# Patient Record
Sex: Female | Born: 1970 | Hispanic: Yes | Smoking: Former smoker
Health system: Southern US, Community
[De-identification: ages and names within clinical notes are randomized; demographics above are authoritative.]

## PROBLEM LIST (undated history)

## (undated) DIAGNOSIS — E559 Vitamin D deficiency, unspecified: Secondary | ICD-10-CM

## (undated) DIAGNOSIS — F329 Major depressive disorder, single episode, unspecified: Principal | ICD-10-CM

## (undated) DIAGNOSIS — F419 Anxiety disorder, unspecified: Principal | ICD-10-CM

## (undated) HISTORY — DX: Major depressive disorder, single episode, unspecified: F32.9

## (undated) HISTORY — PX: APPENDECTOMY: SHX54

## (undated) HISTORY — DX: Vitamin D deficiency, unspecified: E55.9

## (undated) HISTORY — PX: CHOLECYSTECTOMY: SHX55

## (undated) HISTORY — DX: Anxiety disorder, unspecified: F41.9

---

## 2007-07-21 LAB — CONVERTED CEMR LAB: Pap Smear: NORMAL

## 2009-01-16 ENCOUNTER — Emergency Department (HOSPITAL_BASED_OUTPATIENT_CLINIC_OR_DEPARTMENT_OTHER): Admission: EM | Admit: 2009-01-16 | Discharge: 2009-01-16 | Payer: Self-pay | Admitting: Emergency Medicine

## 2009-10-23 ENCOUNTER — Ambulatory Visit: Payer: Self-pay | Admitting: Internal Medicine

## 2009-10-23 DIAGNOSIS — F32A Depression, unspecified: Secondary | ICD-10-CM

## 2009-10-23 DIAGNOSIS — F419 Anxiety disorder, unspecified: Secondary | ICD-10-CM

## 2009-10-23 DIAGNOSIS — R5381 Other malaise: Secondary | ICD-10-CM | POA: Insufficient documentation

## 2009-10-23 DIAGNOSIS — S6980XA Other specified injuries of unspecified wrist, hand and finger(s), initial encounter: Secondary | ICD-10-CM

## 2009-10-23 DIAGNOSIS — R5383 Other fatigue: Secondary | ICD-10-CM

## 2009-10-23 DIAGNOSIS — F329 Major depressive disorder, single episode, unspecified: Secondary | ICD-10-CM | POA: Insufficient documentation

## 2009-10-23 DIAGNOSIS — S6990XA Unspecified injury of unspecified wrist, hand and finger(s), initial encounter: Secondary | ICD-10-CM | POA: Insufficient documentation

## 2009-10-23 HISTORY — DX: Depression, unspecified: F32.A

## 2009-10-23 HISTORY — DX: Anxiety disorder, unspecified: F41.9

## 2009-10-29 LAB — CONVERTED CEMR LAB
Basophils Absolute: 0 10*3/uL (ref 0.0–0.1)
Basophils Relative: 0 % (ref 0.0–3.0)
CO2: 28 meq/L (ref 19–32)
Calcium: 9.3 mg/dL (ref 8.4–10.5)
Chloride: 106 meq/L (ref 96–112)
Eosinophils Absolute: 0.4 10*3/uL (ref 0.0–0.7)
Glucose, Bld: 81 mg/dL (ref 70–99)
Hemoglobin: 12 g/dL (ref 12.0–15.0)
Lymphocytes Relative: 29.1 % (ref 12.0–46.0)
Lymphs Abs: 2.7 10*3/uL (ref 0.7–4.0)
MCHC: 32.5 g/dL (ref 30.0–36.0)
MCV: 89.5 fL (ref 78.0–100.0)
Monocytes Absolute: 0.4 10*3/uL (ref 0.1–1.0)
Neutro Abs: 5.9 10*3/uL (ref 1.4–7.7)
RBC: 4.11 M/uL (ref 3.87–5.11)
RDW: 13.1 % (ref 11.5–14.6)
Sodium: 139 meq/L (ref 135–145)

## 2009-11-05 ENCOUNTER — Encounter: Payer: Self-pay | Admitting: Internal Medicine

## 2010-07-19 ENCOUNTER — Encounter: Payer: Self-pay | Admitting: Internal Medicine

## 2010-07-25 ENCOUNTER — Ambulatory Visit: Payer: Self-pay | Admitting: Internal Medicine

## 2010-11-19 NOTE — Assessment & Plan Note (Signed)
Summary: mmr/cbs  Nurse Visit  CC: MMR vacc./kb   Immunizations Administered:  MMR Vaccine # 1:    Vaccine Type: MMR    Site: left deltoid    Mfr: Merck    Dose: 0.5 ml    Route: IM    Given by: Lucious Groves CMA    Exp. Date: 01/26/2012    Lot #: [id number]    VIS given: 12/31/06 version given July 25, 2010.  Orders Added: 1)  MMR Vaccine SQ [90707] 2)  Admin 1st Vaccine [90471]   Patient is aware that she needs 2 MMR vaccinations. Patient will make appt for next month to receive the second. Lucious Groves CMA  July 25, 2010 11:20 AM

## 2010-11-19 NOTE — Assessment & Plan Note (Signed)
Summary: new to be est.- jr   Vital Signs:  Patient profile:   40 year old female Height:      64 inches Weight:      189.6 pounds BMI:     32.66 Pulse rate:   72 / minute BP sitting:   120 / 82  Vitals Entered By: Shary Decamp (October 23, 2009 1:14 PM) CC: new pt to est - not fasting Is Patient Diabetic? No Comments Multiple concerns:  - check veins in legs  - fell down stairs 1 week ago -- pain @ tailbone  - injury to left index finger years ago - c/o of finger "not working properly"  - fatigue Shary Decamp  October 23, 2009 1:15 PM    History of Present Illness: new patient, multiple concerns: --check  "spyder veins" in legs,  --fell down stairs 1 week ago, pain @ tailbone no hematoma no radiation, pain  worse when stands up or seats down -- injury to left index finger few months ago, range of motion has not returned.  The third finger was repaired  at the ER, the index finger was not suture --fatigue x 2 years , symptom is nonspecific, just lack of energy, see review of systems   Preventive Screening-Counseling & Management  Alcohol-Tobacco     Alcohol drinks/day: 0     Smoking Status: never  Caffeine-Diet-Exercise     Caffeine use/day: 2     Does Patient Exercise: no  Past History:  Past Medical History: G2 P2 Gyn Dr Joellyn Haff  Past Surgical History: Caesarean section x 2 Appendectomy Cholecystectomy  Family History: M - living F - living CAD - no DM - GF HTN - no stroke - no colon Ca - no breast Ca - no  Social History: Married 2 daughters tobacco--no ETOH-- rarely  original from [place] Smoking Status:  never Caffeine use/day:  2 Does Patient Exercise:  no  Review of Systems General:  sedentary life style diet healthy on-off . CV:  Denies chest pain or discomfort, palpitations, and swelling of feet. Resp:  + snoring somnolent at times, no innapropiate falling sleep. GI:  Denies diarrhea, nausea, and vomiting. Psych:  Denies  anxiety; + depression x years, on-off, mild miss her family in [place], a lot of work raising her daughters  poor concentration, ?ADHD .  Physical Exam  General:  alert, well-developed, and overweight-appearing.   Neck:  no masses and no thyromegaly.   Lungs:  normal respiratory effort, no intercostal retractions, no accessory muscle use, and normal breath sounds.   Heart:  normal rate, regular rhythm, no murmur, and no gallop.   Msk:  not tender over the coccyx area Extremities:  no lower extremity edema. Left hand: Index with eschar over the DIP, unable to flex the finger hundred percent. Third finger with a suture  scar, range of motion is normal Skin:  few spider veins in the legs Psych:  Oriented X3, memory intact for recent and remote, and good eye contact.  both emotional during the interview, tearful   Impression & Recommendations:  Problem # 1:  FATIGUE (ICD-780.79) likely multifactorial: deconditioning, depression, obesity, ?OSA labs recommend routine exercises. Reassess in 3 months  Orders: Venipuncture (53664) TLB-BMP (Basic Metabolic Panel-BMET) (80048-METABOL) TLB-CBC Platelet - w/Differential (85025-CBCD) TLB-TSH (Thyroid Stimulating Hormone) (84443-TSH) TLB-B12 + Folate Pnl (40347_42595-G38/VFI)  Problem # 2:  INJURY, FINGER (ICD-959.5) suspect that the scar tissue in the finger is preventing her finger to fully flex. 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 The patient is also concerned about ADHD. Refer to a psychologist  Orders: Psychology Referral (Psychology)  Problem # 4:  other issues spider veins: Observation. tailbone pain: Observation  Problem # 5:  ? of ADHD (ICD-314.01) see #3  Patient Instructions: 1)  Please schedule a follow-up appointment in 3 months .    Preventive Care  Screening  Pap Smear:    Date:  07/21/2007    Results:  normal     Mammogram  Procedure date:  06/20/2008  Findings:      No specific mammographic evidence of malignancy.     Mammogram  Procedure date:  06/20/2008  Findings:      No specific mammographic evidence of malignancy.

## 2010-11-19 NOTE — Consult Note (Signed)
Summary: [facility]   Imported By: Lanelle Bal 11/16/2009 13:19:37  _____________________________________________________________________  External Attachment:    Type:   Image     Comment:   External Document

## 2010-12-20 ENCOUNTER — Encounter: Payer: Self-pay | Admitting: Internal Medicine

## 2010-12-20 ENCOUNTER — Ambulatory Visit (INDEPENDENT_AMBULATORY_CARE_PROVIDER_SITE_OTHER): Payer: 59 | Admitting: Internal Medicine

## 2010-12-20 DIAGNOSIS — H6120 Impacted cerumen, unspecified ear: Secondary | ICD-10-CM | POA: Insufficient documentation

## 2010-12-20 DIAGNOSIS — H9209 Otalgia, unspecified ear: Secondary | ICD-10-CM

## 2010-12-26 NOTE — Assessment & Plan Note (Signed)
Summary: ear pain, hears high pitch noise///sph   Vital Signs:  Patient profile:   40 year old female Weight:      183.4 pounds BMI:     31.59 Temp:     98.7 degrees F oral Pulse rate:   64 / minute Resp:     15 per minute BP sitting:   112 / 78  (left arm) Cuff size:   large  Vitals Entered By: Shonna Chock CMA (December 20, 2010 4:04 PM) CC: Right ear pain and high pitch sound x 10 days or longer , Ear pain   CC:  Right ear pain and high pitch sound x 10 days or longer  and Ear pain.  History of Present Illness:    Onset 10-14 days ago as acute pain in R ear; she now  reports sensation of fullness and tinnitus, but denies ear discharge, hearing loss, fever, sinus pain, nasal discharge, and jaw click.  The pain is described as intermittent & sharp.  The patient denies headache, toothache, dizziness, and vertigo. Rx: none  Current Medications (verified): 1)  Prilosec Otc 20 Mg Tbec (Omeprazole Magnesium) .Marland Kitchen.. 1 By Mouth Once Daily  Allergies (verified): 1)  ! Sulfa  Physical Exam  General:  well-nourished,in no acute distress; alert,appropriate and cooperative throughout examination Ears:  L  & R Cerumen impaction, Rinne normal (Ac> BC), and Weber abnormal, lateralizes to R.  Whisper heard @ 6 ft  Nose:  External nasal examination shows no deformity or inflammation. Nasal mucosa are pink and moist without lesions or exudates. Mouth:  Oral mucosa and oropharynx without lesions or exudates.  Teeth in good repair. R tonsil enlarged  with  tonsilar inclusion Cervical Nodes:  No lymphadenopathy noted Axillary Nodes:  No palpable lymphadenopathy   Impression & Recommendations:  Problem # 1:  EAR PAIN, RIGHT (ICD-388.70) Component of Eustachian tube dysfunction from enlarged R tonsil  probably due to inflmmation from inclusion  Problem # 2:  CERUMEN IMPACTION, BILATERAL (ICD-380.4)  Complete Medication List: 1)  Prilosec Otc 20 Mg Tbec (Omeprazole magnesium) .Marland Kitchen.. 1 by mouth once  daily  Patient Instructions: 1)  Mineral oil 3 drops in ear at bedtime ; cover with cotton ball. In am soak canal with Hydrogen Peroxide for 10-15 min then shower , using thin washrag.Use a Water Pic to wash out debris from tonsils .The debris is causing inflammation & swelling which will block the Eustachian. ENT referral if no better.   Orders Added: 1)  Est. Patient Level III [27035]

## 2011-09-01 ENCOUNTER — Encounter: Payer: Self-pay | Admitting: Internal Medicine

## 2011-09-01 ENCOUNTER — Ambulatory Visit (INDEPENDENT_AMBULATORY_CARE_PROVIDER_SITE_OTHER): Payer: 59 | Admitting: Internal Medicine

## 2011-09-01 VITALS — BP 138/84 | HR 80 | Temp 98.4°F | Resp 20 | Ht 64.0 in | Wt 172.1 lb

## 2011-09-01 DIAGNOSIS — E871 Hypo-osmolality and hyponatremia: Secondary | ICD-10-CM | POA: Insufficient documentation

## 2011-09-01 DIAGNOSIS — E611 Iron deficiency: Secondary | ICD-10-CM

## 2011-09-01 NOTE — Progress Notes (Signed)
  Subjective:    Patient ID: Cassidy Shepard, female    DOB: Mar 30, 1971, 40 y.o.   MRN: [id number]  HPI Had labs done 08/20/2011 at work Labs were done  after she drank Gatorade, had some abnormalities that likes to d/w me  Blood sugar 102, serum sodium 133 slightly low, iron 30 slightly low, all other chemistries, CBC, cholesterol and thyroid tests were normal.  Past Medical History  Diagnosis Date  . No active medical problems    Past Surgical History  Procedure Date  . Appendectomy   . Cholecystectomy   . Cesarean section     x 2  . Intrauterine device insertion 2006   History   Social History  . Marital Status: Married    Spouse Name: N/A    Number of Children: 2  . Years of Education: N/A   Occupational History  . Not on file.   Social History Main Topics  . Smoking status: Never Smoker   . Smokeless tobacco: Never Used  . Alcohol Use: Yes     socially   . Drug Use: Not on file  . Sexually Active: Not on file   Other Topics Concern  . Not on file   Social History Narrative   Married, 2 daughters----original from [place]     Review of Systems Periods are normal, monthly, last 2-1/2 days. Denies nausea, vomiting, diarrhea, blood in the stools. Occasional epigastric discomfort.    Objective:   Physical Exam  Constitutional: She appears well-nourished.  Cardiovascular:  No murmur heard. Abdominal: Soft. Bowel sounds are normal. She exhibits no distension. There is no tenderness. There is no rebound and no guarding.  Musculoskeletal: She exhibits no edema.  Psychiatric: She has a normal mood and affect. Her behavior is normal. Judgment and thought content normal.          Assessment & Plan:  Abnormal labs: Very mild abnormal findings. Sodium slightly low, iron slightly low but no anemia. Findings unlikely to be significant. Recommend to repeat abnormal labs in 6 months. Also she is due for a gynecological eval, recommend the patient to set that up. F2F >  15 min discussing results

## 2011-12-02 ENCOUNTER — Ambulatory Visit (INDEPENDENT_AMBULATORY_CARE_PROVIDER_SITE_OTHER): Payer: 59 | Admitting: Internal Medicine

## 2011-12-02 VITALS — BP 116/74 | HR 76 | Temp 98.6°F | Wt 173.0 lb

## 2011-12-02 DIAGNOSIS — J329 Chronic sinusitis, unspecified: Secondary | ICD-10-CM

## 2011-12-02 MED ORDER — PREDNISONE 10 MG PO TABS: ORAL_TABLET | ORAL | Status: DC

## 2011-12-02 MED ORDER — FLUTICASONE PROPIONATE 50 MCG/ACT NA SUSP: 2.0000 | Freq: Every day | NASAL | Status: DC

## 2011-12-02 MED ORDER — AMOXICILLIN 500 MG PO CAPS: 1000.0000 mg | ORAL_CAPSULE | Freq: Two times a day (BID) | ORAL | Status: AC

## 2011-12-02 NOTE — Patient Instructions (Signed)
take Mucinex twice a day x 1 week For congestion use flonase x 1 month Take the antibiotic as prescribed ----> Amoxicillin  prednisone x 5 days Call if no better in few days, call anytime if the symptoms are severe cerumenex to help with wax Next visit by April to re do labs

## 2011-12-02 NOTE — Progress Notes (Signed)
  Subjective:    Patient ID: Cassidy Shepard, female    DOB: May 02, 1971, 41 y.o.   MRN: [id number]  HPI Acute visit In December she was  in an airplane and when they were landing she develop severe behind the R eye pain that lasted a couple of minutes. Since then she has on and off facial pressure, nasal pressure and a "nasal voice". Sinusitis?.  Past Medical History  Diagnosis Date  . No active medical problems    Past Surgical History  Procedure Date  . Appendectomy   . Cholecystectomy   . Cesarean section     x 2  . Intrauterine device insertion 2006    Review of Systems No fever or chills Occasional frontal headache Occasionally her eyes feel irritated but no visual disturbances. Postnasal dripping on and off. Denies any sore throat, and GERD symptoms, cough or chest congestion.      Objective:   Physical Exam  Constitutional: She appears well-developed and well-nourished. No distress.  HENT:  Head: Normocephalic and atraumatic.       Face symmetric, no TTP, wax B, throat normal, gum palpation wnl, no evidence of abscess. Nose wnl, voice noted to be nasal  Cardiovascular: Normal rate, regular rhythm and normal heart sounds.   No murmur heard. Pulmonary/Chest: Effort normal and breath sounds normal. No respiratory distress. She has no wheezes. She has no rales.  Skin: She is not diaphoretic.      Assessment & Plan:  Sinusitis: Several weeks h/o facial nasal pressure, subacute sinusitis? See instructions cerumenex for wax

## 2011-12-03 ENCOUNTER — Encounter: Payer: Self-pay | Admitting: Internal Medicine

## 2012-01-02 ENCOUNTER — Telehealth: Payer: Self-pay | Admitting: Internal Medicine

## 2012-01-02 DIAGNOSIS — J329 Chronic sinusitis, unspecified: Secondary | ICD-10-CM

## 2012-01-02 NOTE — Telephone Encounter (Signed)
Patient came in & stated her symptoms from the 11/2011 visit are no better. She states was told to let us know if they did not get any & we would order a CT scan. Please call  Harpreet @ [phone]

## 2012-01-02 NOTE — Telephone Encounter (Signed)
Please advise 

## 2012-01-02 NOTE — Telephone Encounter (Signed)
Advise patient: i prefer her to see ENT, please arrange referral

## 2012-01-02 NOTE — Telephone Encounter (Signed)
Spoke with pt, set up referral.

## 2012-02-25 ENCOUNTER — Other Ambulatory Visit: Payer: Self-pay | Admitting: Otolaryngology

## 2012-02-25 DIAGNOSIS — J342 Deviated nasal septum: Secondary | ICD-10-CM

## 2012-02-25 DIAGNOSIS — R51 Headache: Secondary | ICD-10-CM

## 2012-02-26 ENCOUNTER — Ambulatory Visit
Admission: RE | Admit: 2012-02-26 | Discharge: 2012-02-26 | Disposition: A | Discharge status: Home / Self Care | Payer: 59 | Source: Ambulatory Visit | Attending: Otolaryngology | Admitting: Otolaryngology

## 2012-02-26 DIAGNOSIS — J342 Deviated nasal septum: Secondary | ICD-10-CM

## 2012-02-26 DIAGNOSIS — R51 Headache: Secondary | ICD-10-CM

## 2012-09-24 ENCOUNTER — Other Ambulatory Visit (INDEPENDENT_AMBULATORY_CARE_PROVIDER_SITE_OTHER): Payer: 59

## 2012-09-24 ENCOUNTER — Ambulatory Visit (INDEPENDENT_AMBULATORY_CARE_PROVIDER_SITE_OTHER): Payer: 59 | Admitting: Internal Medicine

## 2012-09-24 ENCOUNTER — Encounter: Payer: Self-pay | Admitting: Internal Medicine

## 2012-09-24 VITALS — BP 120/76 | HR 73 | Temp 98.1°F | Ht 63.0 in | Wt 174.0 lb

## 2012-09-24 DIAGNOSIS — F419 Anxiety disorder, unspecified: Secondary | ICD-10-CM

## 2012-09-24 DIAGNOSIS — R5381 Other malaise: Secondary | ICD-10-CM

## 2012-09-24 DIAGNOSIS — F32A Depression, unspecified: Secondary | ICD-10-CM

## 2012-09-24 DIAGNOSIS — F411 Generalized anxiety disorder: Secondary | ICD-10-CM

## 2012-09-24 DIAGNOSIS — R5383 Other fatigue: Secondary | ICD-10-CM

## 2012-09-24 DIAGNOSIS — W19XXXA Unspecified fall, initial encounter: Secondary | ICD-10-CM

## 2012-09-24 DIAGNOSIS — M791 Myalgia, unspecified site: Secondary | ICD-10-CM

## 2012-09-24 DIAGNOSIS — F329 Major depressive disorder, single episode, unspecified: Secondary | ICD-10-CM

## 2012-09-24 LAB — LIPID PANEL
Cholesterol: 163 mg/dL (ref 0–200)
LDL Cholesterol: 99 mg/dL (ref 0–99)
Triglycerides: 122 mg/dL (ref 0.0–149.0)
VLDL: 24.4 mg/dL (ref 0.0–40.0)

## 2012-09-24 LAB — BASIC METABOLIC PANEL
Chloride: 105 mEq/L (ref 96–112)
Creatinine, Ser: 0.6 mg/dL (ref 0.4–1.2)
Potassium: 4.1 mEq/L (ref 3.5–5.1)

## 2012-09-24 LAB — CBC
MCHC: 32.9 g/dL (ref 30.0–36.0)
RDW: 13.4 % (ref 11.5–14.6)

## 2012-09-24 LAB — VITAMIN B12: Vitamin B-12: 382 pg/mL (ref 211–911)

## 2012-09-24 LAB — HEPATIC FUNCTION PANEL
AST: 13 U/L (ref 0–37)
Albumin: 3.9 g/dL (ref 3.5–5.2)
Alkaline Phosphatase: 57 U/L (ref 39–117)
Bilirubin, Direct: 0 mg/dL (ref 0.0–0.3)

## 2012-09-24 MED ORDER — ALPRAZOLAM 0.5 MG PO TABS: 0.5000 mg | ORAL_TABLET | Freq: Every evening | ORAL | Status: DC | PRN

## 2012-09-24 MED ORDER — FLUOXETINE HCL 20 MG PO TABS: 20.0000 mg | ORAL_TABLET | Freq: Every day | ORAL | Status: DC

## 2012-09-24 MED ORDER — CYCLOBENZAPRINE HCL 10 MG PO TABS: 10.0000 mg | ORAL_TABLET | Freq: Three times a day (TID) | ORAL | Status: DC | PRN

## 2012-09-24 NOTE — Patient Instructions (Signed)
Depression, Adult Depression refers to feeling sad, low, down in the dumps, blue, gloomy, or empty. In general, there are two kinds of depression: 1. Depression that we all experience from time to time because of upsetting life experiences, including the loss of a job or the ending of a relationship (normal sadness or normal grief). This kind of depression is considered normal, is short lived, and resolves within a few days to 2 weeks. (Depression experienced after the loss of a loved one is called bereavement. Bereavement often lasts longer than 2 weeks but normally gets better with time.) 2. Clinical depression, which lasts longer than normal sadness or normal grief or interferes with your ability to function at home, at work, and in school. It also interferes with your personal relationships. It affects almost every aspect of your life. Clinical depression is an illness. Symptoms of depression also can be caused by conditions other than normal sadness and grief or clinical depression. Examples of these conditions are listed as follows:  Physical illness Some physical illnesses, including underactive thyroid gland (hypothyroidism), severe anemia, specific types of cancer, diabetes, uncontrolled seizures, heart and lung problems, strokes, and chronic pain are commonly associated with symptoms of depression.  Side effects of some prescription medicine In some people, certain types of prescription medicine can cause symptoms of depression.  Substance abuse Abuse of alcohol and illicit drugs can cause symptoms of depression. SYMPTOMS Symptoms of normal sadness and normal grief include the following:  Feeling sad or crying for short periods of time.  Not caring about anything (apathy).  Difficulty sleeping or sleeping too much.  No longer able to enjoy the things you used to enjoy.  Desire to be by oneself all the time (social isolation).  Lack of energy or motivation.  Difficulty  concentrating or remembering.  Change in appetite or weight.  Restlessness or agitation. Symptoms of clinical depression include the same symptoms of normal sadness or normal grief and also the following symptoms:  Feeling sad or crying all the time.  Feelings of guilt or worthlessness.  Feelings of hopelessness or helplessness.  Thoughts of suicide or the desire to harm yourself (suicidal ideation).  Loss of touch with reality (psychotic symptoms). Seeing or hearing things that are not real (hallucinations) or having false beliefs about your life or the people around you (delusions and paranoia). DIAGNOSIS  The diagnosis of clinical depression usually is based on the severity and duration of the symptoms. Your caregiver also will ask you questions about your medical history and substance use to find out if physical illness, use of prescription medicine, or substance abuse is causing your depression. Your caregiver also may order blood tests. TREATMENT  Typically, normal sadness and normal grief do not require treatment. However, sometimes antidepressant medicine is prescribed for bereavement to ease the depressive symptoms until they resolve. The treatment for clinical depression depends on the severity of your symptoms but typically includes antidepressant medicine, counseling with a mental health professional, or a combination of both. Your caregiver will help to determine what treatment is best for you. Depression caused by physical illness usually goes away with appropriate medical treatment of the illness. If prescription medicine is causing depression, talk with your caregiver about stopping the medicine, decreasing the dose, or substituting another medicine. Depression caused by abuse of alcohol or illicit drugs abuse goes away with abstinence from these substances. Some adults need professional help in order to stop drinking or using drugs. SEEK IMMEDIATE CARE IF:  You have   thoughts  about hurting yourself or others.  You lose touch with reality (have psychotic symptoms).  You are taking medicine for depression and have a serious side effect. FOR MORE INFORMATION National Alliance on Mental Illness: www.nami.org National Institute of Mental Health: www.nimh.nih.gov Document Released: 10/03/2000 Document Revised: 04/06/2012 Document Reviewed: 01/05/2012 ExitCare Patient Information 2013 [employer]. Anxiety and Panic Attacks Your caregiver has informed you that you are having an anxiety or panic attack. There may be many forms of this. Most of the time these attacks come suddenly and without warning. They come at any time of day, including periods of sleep, and at any time of life. They may be strong and unexplained. Although panic attacks are very scary, they are physically harmless. Sometimes the cause of your anxiety is not known. Anxiety is a protective mechanism of the body in its fight or flight mechanism. Most of these perceived danger situations are actually nonphysical situations (such as anxiety over losing a job). CAUSES  The causes of an anxiety or panic attack are many. Panic attacks may occur in otherwise healthy people given a certain set of circumstances. There may be a genetic cause for panic attacks. Some medications may also have anxiety as a side effect. SYMPTOMS  Some of the most common feelings are:  Intense terror.  Dizziness, feeling faint.  Hot and cold flashes.  Fear of going crazy.  Feelings that nothing is real.  Sweating.  Shaking.  Chest pain or a fast heartbeat (palpitations).  Smothering, choking sensations.  Feelings of impending doom and that death is near.  Tingling of extremities, this may be from over-breathing.  Altered reality (derealization).  Being detached from yourself (depersonalization). Several symptoms can be present to make up anxiety or panic attacks. DIAGNOSIS  The evaluation by your caregiver will  depend on the type of symptoms you are experiencing. The diagnosis of anxiety or panic attack is made when no physical illness can be determined to be a cause of the symptoms. TREATMENT  Treatment to prevent anxiety and panic attacks may include:  Avoidance of circumstances that cause anxiety.  Reassurance and relaxation.  Regular exercise.  Relaxation therapies, such as yoga.  Psychotherapy with a psychiatrist or therapist.  Avoidance of caffeine, alcohol and illegal drugs.  Prescribed medication. SEEK IMMEDIATE MEDICAL CARE IF:   You experience panic attack symptoms that are different than your usual symptoms.  You have any worsening or concerning symptoms. Document Released: 10/06/2005 Document Revised: 12/29/2011 Document Reviewed: 02/07/2010 ExitCare Patient Information 2013 [employer].  

## 2012-09-24 NOTE — Progress Notes (Signed)
Subjective:    Patient ID: Cassidy Shepard, female    DOB: 09-07-1971, 41 y.o.   MRN: [id number]  HPI  Pt presents to the clinic today with c/o muscle aches, fatigue, depression and anxiety that causes chest pain. She is having severe marital problems. She feels like crying all time. She has little interest in spending time with her two children. She feels withdrawn and constantly upset. This has been an ongoing problem but has gotten much worse in the last week. The chest pain is intermittent, only when she is nervous and goes away quickly. She does spend most of her time at home in the bed. She has missed some work because of these episodes.  Review of Systems      Past Medical History  Diagnosis Date  . No active medical problems     Current Outpatient Prescriptions  Medication Sig Dispense Refill  . cetirizine (ZYRTEC) 10 MG tablet Take 10 mg by mouth daily.      . fluticasone (FLONASE) 50 MCG/ACT nasal spray Place 2 sprays into the nose daily.  16 g  2  . predniSONE (DELTASONE) 10 MG tablet 2 tabs a day x 5 days  10 tablet  0    Allergies  Allergen Reactions  . Sulfonamide Derivatives     REACTION: rash    No family history on file.  History   Social History  . Marital Status: Married    Spouse Name: N/A    Number of Children: 2  . Years of Education: N/A   Occupational History  . Not on file.   Social History Main Topics  . Smoking status: Never Smoker   . Smokeless tobacco: Never Used  . Alcohol Use: Yes     Comment: socially   . Drug Use: Not on file  . Sexually Active: Not on file   Other Topics Concern  . Not on file   Social History Narrative   Married, 2 daughters----original from [place]      Constitutional: Pt reports fatigue. Denies malaise, fever, headache or abrupt weight changes.  HEENT: Denies eye pain, eye redness, ear pain, ringing in the ears, wax buildup, runny nose, nasal congestion, bloody nose, or sore throat. Respiratory: Denies  difficulty breathing, shortness of breath, cough or sputum production.   Cardiovascular: Pt reports intermittant chest pain due to anxiety. Denies  chest tightness, palpitations or swelling in the hands or feet.  Gastrointestinal: Denies abdominal pain, bloating, constipation, diarrhea or blood in the stool.  GU: Denies urgency, frequency, pain with urination, burning sensation, blood in urine, odor or discharge. Musculoskeletal: Pt reports generalized muscle aches. Denies decrease in range of motion, difficulty with gait, muscle pain or joint pain and swelling.  Skin: Denies redness, rashes, lesions or ulcercations.  Neurological: Denies dizziness, difficulty with memory, difficulty with speech or problems with balance and coordination.   No other specific complaints in a complete review of systems (except as listed in HPI above).  Objective:   Physical Exam  BP 120/76  Pulse 73  Temp 98.1 F (36.7 C) (Oral)  Ht 5\' 3"  (1.6 m)  Wt 174 lb (78.926 kg)  BMI 30.82 kg/m2  SpO2 96% Wt Readings from Last 3 Encounters:  09/24/12 174 lb (78.926 kg)  12/02/11 173 lb (78.472 kg)  09/01/11 172 lb 2 oz (78.075 kg)    General: Appears her stated age, well developed, well nourished in NAD. Cardiovascular: Normal rate and rhythm. S1,S2 noted.  No murmur, rubs or  gallops noted. No JVD or BLE edema. No carotid bruits noted. Pulmonary/Chest: Normal effort and positive vesicular breath sounds. No respiratory distress. No wheezes, rales or ronchi noted.  Musculoskeletal: Normal range of motion. No signs of joint swelling. No difficulty with gait.  Neurological: Alert and oriented. Cranial nerves II-XII intact. Coordination normal. +DTRs bilaterally. Psychiatric: Mood and affect flat. Patient is crying. Behavior is normal. Judgment and thought content normal.      Assessment & Plan:   Depression and Anxiety, new onset with additional workup required:  eRx given for Prozac 20 mg daily eRx givne for  xanax 0.5 mg tid prn eRx given for flexeril Reassurance given, encouraged counseling.  RTC in1 month to reassess

## 2012-09-27 ENCOUNTER — Other Ambulatory Visit: Payer: Self-pay | Admitting: Internal Medicine

## 2012-09-27 ENCOUNTER — Encounter: Payer: Self-pay | Admitting: Internal Medicine

## 2012-09-27 ENCOUNTER — Telehealth: Payer: Self-pay | Admitting: *Deleted

## 2012-09-27 MED ORDER — ERGOCALCIFEROL 1.25 MG (50000 UT) PO CAPS: 50000.0000 [IU] | ORAL_CAPSULE | ORAL | Status: DC

## 2012-09-27 NOTE — Progress Notes (Signed)
Vit d level 19, will order ergocalciferol x 12 weeks, then repeat level

## 2012-09-27 NOTE — Telephone Encounter (Signed)
Left message for pt to callback office.     Ash, Can you please call Ms. Krebbs and let her know that all her labs were normal. Thx!  Rene Kocher

## 2012-09-28 NOTE — Telephone Encounter (Signed)
Pt informed of lab results. 

## 2012-12-19 IMAGING — CT CT PARANASAL SINUSES LIMITED
1 of 2 series · 10 of 13 positions shown, 13 images · non-contrast
Comparison: None.

CLINICAL DATA: Congestion and drainage.  Deviated nasal septum.
Headache.  Right maxillary and eye pain.

CT LIMITED SINUSES WITHOUT CONTRAST
TECHNIQUE: Multidetector CT images of the paranasal sinuses were
obtained in a single plane without contrast.

[Series 3: cor soft · axial · 0.31mm/px · z∈[+8,+98]mm · 10 of 12 slices shown, 13 images]
[im 2/12  brain]
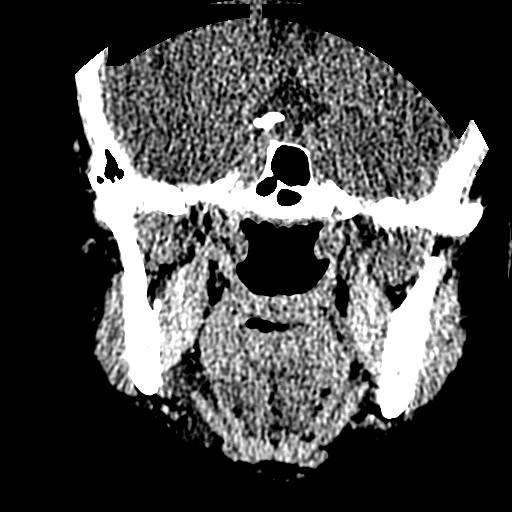
[im 2/12  bone]
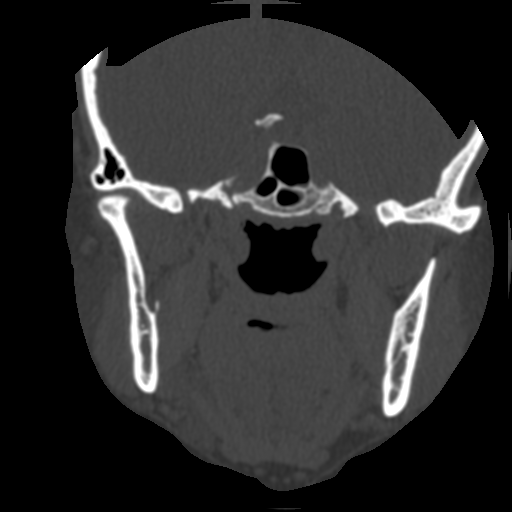
[im 3/12  bone]
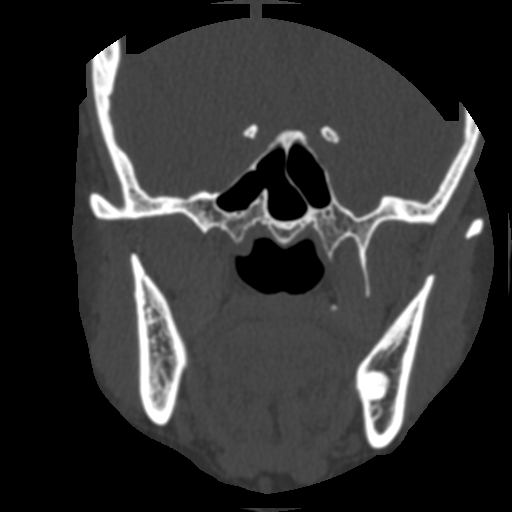
[im 4/12  bone]
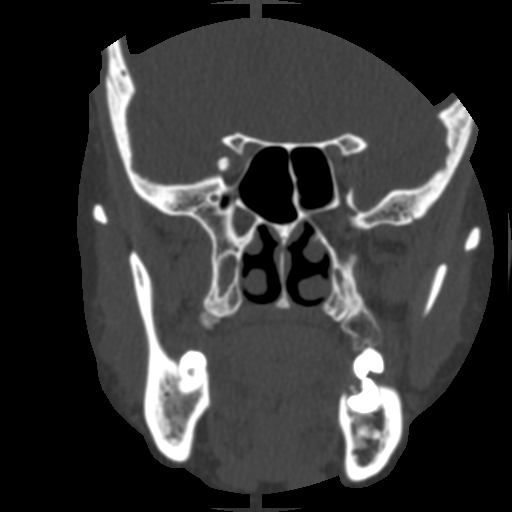
[im 5/12  bone]
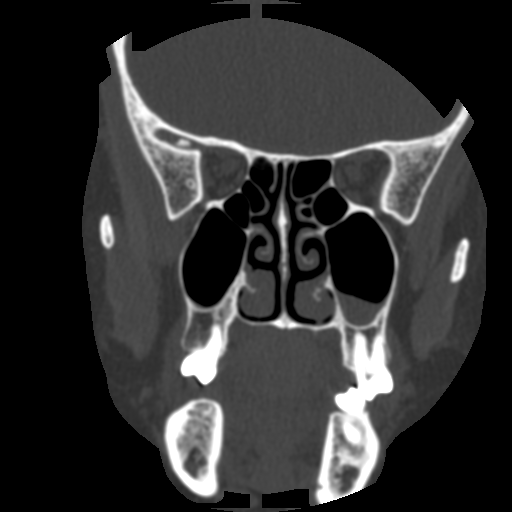
[im 6/12  brain]
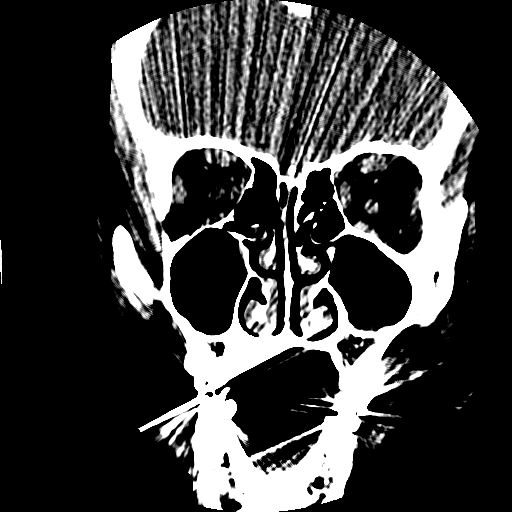
[im 6/12  bone]
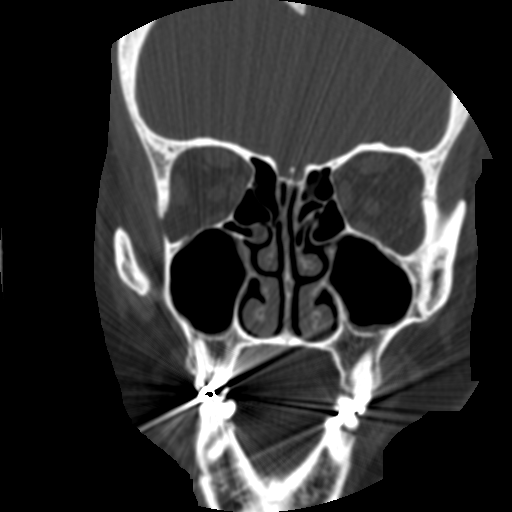
[im 7/12  bone]
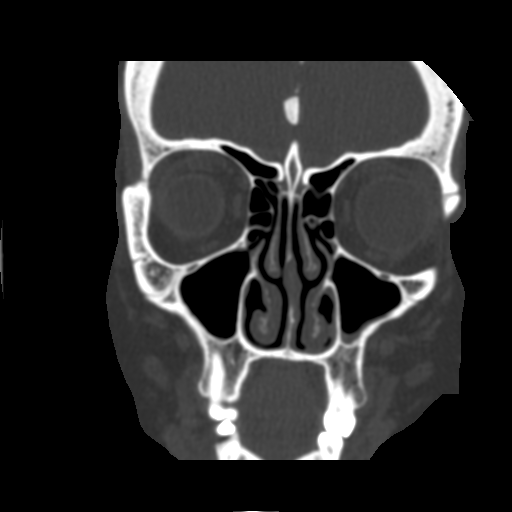
[im 8/12  bone]
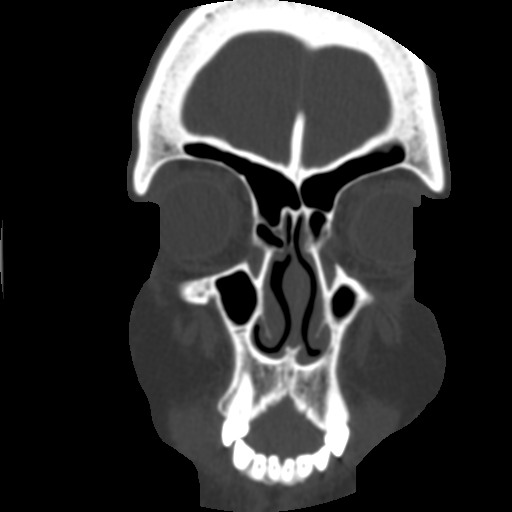
[im 9/12  bone]
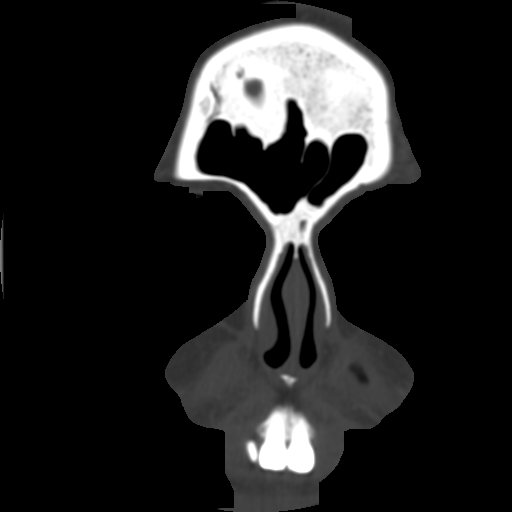
[im 10/12  brain]
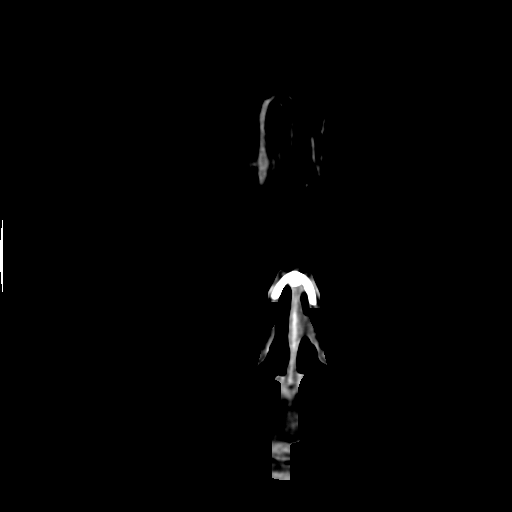
[im 10/12  bone]
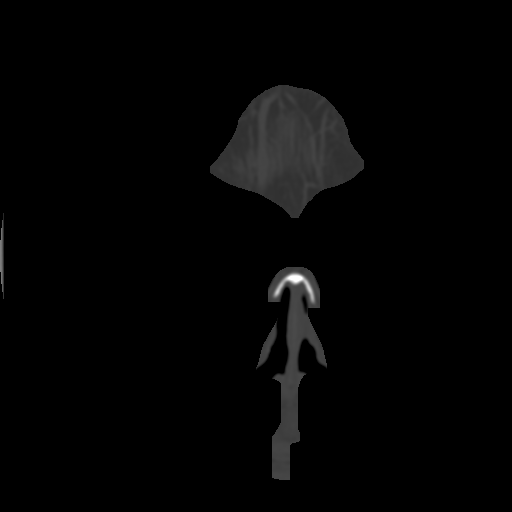
[im 11/12  bone]
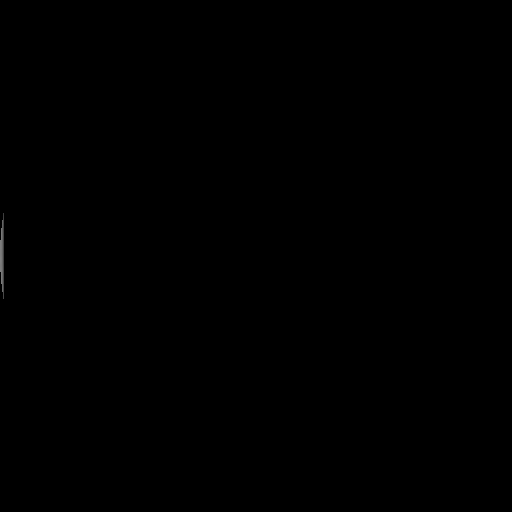

[10 of 13 positions shown; findings below may reference images not displayed]

FINDINGS: A small fluid level is present in the right maxillary
sinus.  Screening examination of the sinuses is otherwise negative.
No significant nasal septal deviation is evident.  Limited imaging
of the brain and orbits is unremarkable.
IMPRESSION: 1.  Mild mucosal thickening and fluid level in the right maxillary
sinus, compatible with acute sinusitis.
2.  No other significant sinus disease.

## 2013-11-02 ENCOUNTER — Other Ambulatory Visit: Payer: Self-pay | Admitting: Internal Medicine

## 2013-11-02 NOTE — Telephone Encounter (Signed)
Last filled 09/22/13 but pt has appt with Dr Larose Kells next week--please advise

## 2013-11-03 NOTE — Telephone Encounter (Signed)
Pt request status of alprazolam refill; pt advised as instructed; pt going out of town today and will cb upon return to schedule appt.

## 2013-11-09 ENCOUNTER — Telehealth: Payer: Self-pay

## 2013-11-09 NOTE — Telephone Encounter (Signed)
Left message for call back Non identifiable No pertinent data in chart

## 2013-11-11 ENCOUNTER — Encounter: Payer: Self-pay | Admitting: Internal Medicine

## 2013-11-11 ENCOUNTER — Ambulatory Visit (INDEPENDENT_AMBULATORY_CARE_PROVIDER_SITE_OTHER): Payer: 59 | Admitting: Internal Medicine

## 2013-11-11 VITALS — BP 142/89 | HR 70 | Temp 98.0°F | Wt 189.0 lb

## 2013-11-11 DIAGNOSIS — Z0000 Encounter for general adult medical examination without abnormal findings: Secondary | ICD-10-CM

## 2013-11-11 DIAGNOSIS — F329 Major depressive disorder, single episode, unspecified: Secondary | ICD-10-CM

## 2013-11-11 DIAGNOSIS — F419 Anxiety disorder, unspecified: Secondary | ICD-10-CM

## 2013-11-11 DIAGNOSIS — F32A Depression, unspecified: Secondary | ICD-10-CM

## 2013-11-11 LAB — TSH: TSH: 0.92 u[IU]/mL (ref 0.35–5.50)

## 2013-11-11 LAB — CBC WITH DIFFERENTIAL/PLATELET
BASOS ABS: 0 10*3/uL (ref 0.0–0.1)
Basophils Relative: 0.4 % (ref 0.0–3.0)
EOS PCT: 2.1 % (ref 0.0–5.0)
Eosinophils Absolute: 0.2 10*3/uL (ref 0.0–0.7)
HCT: 35.7 % — ABNORMAL LOW (ref 36.0–46.0)
Hemoglobin: 11.9 g/dL — ABNORMAL LOW (ref 12.0–15.0)
LYMPHS ABS: 2.5 10*3/uL (ref 0.7–4.0)
LYMPHS PCT: 23.2 % (ref 12.0–46.0)
MCHC: 33.4 g/dL (ref 30.0–36.0)
MCV: 85.7 fl (ref 78.0–100.0)
MONOS PCT: 7.1 % (ref 3.0–12.0)
Monocytes Absolute: 0.8 10*3/uL (ref 0.1–1.0)
NEUTROS ABS: 7.3 10*3/uL (ref 1.4–7.7)
Neutrophils Relative %: 67.2 % (ref 43.0–77.0)
Platelets: 342 10*3/uL (ref 150.0–400.0)
RBC: 4.16 Mil/uL (ref 3.87–5.11)
RDW: 14.3 % (ref 11.5–14.6)
WBC: 10.9 10*3/uL — ABNORMAL HIGH (ref 4.5–10.5)

## 2013-11-11 LAB — COMPREHENSIVE METABOLIC PANEL
ALT: 17 U/L (ref 0–35)
AST: 19 U/L (ref 0–37)
Albumin: 3.7 g/dL (ref 3.5–5.2)
Alkaline Phosphatase: 57 U/L (ref 39–117)
BUN: 6 mg/dL (ref 6–23)
CALCIUM: 8.8 mg/dL (ref 8.4–10.5)
CHLORIDE: 105 meq/L (ref 96–112)
CO2: 27 meq/L (ref 19–32)
Creatinine, Ser: 0.6 mg/dL (ref 0.4–1.2)
GFR: 118.78 mL/min (ref >60.00)
Glucose, Bld: 91 mg/dL (ref 70–99)
Potassium: 3.6 mEq/L (ref 3.5–5.1)
Sodium: 136 mEq/L (ref 135–145)
Total Bilirubin: 0.4 mg/dL (ref 0.3–1.2)
Total Protein: 7.7 g/dL (ref 6.0–8.3)

## 2013-11-11 LAB — CHOLESTEROL, TOTAL: CHOLESTEROL: 156 mg/dL (ref 0–200)

## 2013-11-11 MED ORDER — ALPRAZOLAM 0.5 MG PO TABS: 0.5000 mg | ORAL_TABLET | Freq: Every evening | ORAL | Status: DC | PRN

## 2013-11-11 NOTE — Progress Notes (Deleted)
Pre visit review using our clinic review tool, if applicable. No additional management support is needed unless otherwise documented below in the visit note. 

## 2013-11-11 NOTE — Progress Notes (Signed)
   Subjective:    Patient ID: Cassidy Shepard, female    DOB: 10-19-1971, 43 y.o.   MRN: [id number]  HPI  CPX We also discussed insomnia and depression, see assessment and plan  Past Medical History  Diagnosis Date  . No active medical problems    Past Surgical History  Procedure Laterality Date  . Appendectomy    . Cholecystectomy    . Cesarean section      x 2  . Intrauterine device insertion  2006   History   Social History  . Marital Status: Married    Spouse Name: N/A    Number of Children: 2  . Years of Education: N/A   Occupational History  . works for the city of [place]  . Smoking status: Current Some Day Smoker  . Smokeless tobacco: Never Used     Comment: uses the E-cigarrets   . Alcohol Use: Yes     Comment: socially   . Drug Use: No  . Sexual Activity: Not on file   Other Topics Concern  . Not on file   Social History Narrative   Married, 2 daughters; original from [place]       Family History  Problem Relation Age of Onset  . Colon cancer Neg Hx   . Breast cancer Neg Hx   . Diabetes Other     GF  . CAD Neg Hx      Review of Systems Diet-- regular  Exercise-- not consistent  No  CP, SOB, lower extremity edema Denies  nausea, vomiting diarrhea Denies  blood in the stools (-) cough, sputum production  Denies suicidal ideas        Objective:   Physical Exam BP 142/89  Pulse 70  Temp(Src) 98 F (36.7 C)  Wt 189 lb (85.73 kg)  SpO2 100%  General -- alert, well-developed, NAD.  Neck --no thyromegaly Lungs -- normal respiratory effort, no intercostal retractions, no accessory muscle use, and normal breath sounds.  Heart-- normal rate, regular rhythm, no murmur.  Abdomen-- Not distended, good bowel sounds,soft, non-tender. Extremities-- no pretibial edema bilaterally  Neurologic--  alert & oriented X3. Speech normal, gait normal, strength normal in all extremities.  Psych-- Cognition and judgment appear  intact. Cooperative with normal attention span and concentration. No anxious , slt depressed appearing.      Assessment & Plan:

## 2013-11-11 NOTE — Assessment & Plan Note (Addendum)
She reports that for the last year has been stressed out, she is in the process of separating from her husband. On 10/03/2012 she went elsewhere feeling very anxious, was prescribed Xanax and Prozac. He takes  Xanax rarely and took Prozac for one month only. At this point is both mildly anxious and depressed on and off, PHQ 9 is 14 which is moderate depression. For the past 2 weeks   had some difficulty sleeping, would like a refill of Xanax We took about treatment modalities and we agreed on the following: Xanax at bedtime to help her sleep See a counselor Increase exercise Reassess in 8-10 weeks. She's not ready for SSRIs at this point

## 2013-11-11 NOTE — Assessment & Plan Note (Signed)
Td 09-2013 per pt @ gyn office  Had flu shot Sees gyn for female care  Life style discussed Labs , see instructions

## 2013-11-11 NOTE — Patient Instructions (Signed)
Get your blood work before you leave and a UDS  Next visit is for routine check up regards anxiety  in 8-10 weeks  No need to come back fasting Please make an appointment

## 2013-11-12 LAB — VITAMIN D 25 HYDROXY (VIT D DEFICIENCY, FRACTURES): Vit D, 25-Hydroxy: 25 ng/mL — ABNORMAL LOW (ref 30–89)

## 2013-11-14 NOTE — Telephone Encounter (Signed)
Unable to reach prior to visit  

## 2013-11-15 ENCOUNTER — Encounter: Payer: Self-pay | Admitting: *Deleted

## 2013-11-15 MED ORDER — VITAMIN D (ERGOCALCIFEROL) 1.25 MG (50000 UNIT) PO CAPS: 50000.0000 [IU] | ORAL_CAPSULE | ORAL | Status: DC

## 2013-11-15 NOTE — Addendum Note (Signed)
Addended by: Peggyann Shoals on: 11/15/2013 01:51 PM   Modules accepted: Orders

## 2013-11-21 ENCOUNTER — Telehealth: Payer: Self-pay

## 2013-11-21 NOTE — Telephone Encounter (Signed)
UDS-11/14/2013 Negative--alprazolam--PRN Low risk per Dr Larose Kells

## 2013-11-22 ENCOUNTER — Telehealth: Payer: Self-pay | Admitting: Internal Medicine

## 2013-11-22 NOTE — Telephone Encounter (Signed)
Relevant patient education mailed to patient.  

## 2014-01-10 ENCOUNTER — Ambulatory Visit: Payer: 59 | Admitting: Internal Medicine

## 2014-01-10 DIAGNOSIS — Z0289 Encounter for other administrative examinations: Secondary | ICD-10-CM

## 2014-01-11 ENCOUNTER — Encounter: Payer: Self-pay | Admitting: Internal Medicine

## 2014-05-15 ENCOUNTER — Telehealth: Payer: Self-pay | Admitting: Internal Medicine

## 2014-05-15 NOTE — Telephone Encounter (Signed)
Pt states she was taking Xanax for sleep and it is not working.  She says she can fall asleep but cannot stay asleep. She would like a RX for sleep.  [place]

## 2014-05-15 NOTE — Telephone Encounter (Signed)
error 

## 2014-05-16 NOTE — Telephone Encounter (Signed)
Pt needs appt w/ Dr Larose Kells prior to getting additional prescriptions (has not been seen since Jan)

## 2014-05-18 NOTE — Telephone Encounter (Signed)
Pt is scheduled for 05/22/14

## 2014-05-22 ENCOUNTER — Ambulatory Visit (INDEPENDENT_AMBULATORY_CARE_PROVIDER_SITE_OTHER): Payer: 59 | Admitting: Internal Medicine

## 2014-05-22 ENCOUNTER — Telehealth: Payer: Self-pay | Admitting: Internal Medicine

## 2014-05-22 ENCOUNTER — Encounter: Payer: Self-pay | Admitting: Internal Medicine

## 2014-05-22 VITALS — BP 104/74 | HR 76 | Temp 98.3°F | Wt 174.0 lb

## 2014-05-22 DIAGNOSIS — F341 Dysthymic disorder: Secondary | ICD-10-CM

## 2014-05-22 DIAGNOSIS — E559 Vitamin D deficiency, unspecified: Secondary | ICD-10-CM

## 2014-05-22 DIAGNOSIS — F329 Major depressive disorder, single episode, unspecified: Secondary | ICD-10-CM

## 2014-05-22 DIAGNOSIS — F419 Anxiety disorder, unspecified: Secondary | ICD-10-CM

## 2014-05-22 DIAGNOSIS — F32A Depression, unspecified: Secondary | ICD-10-CM

## 2014-05-22 HISTORY — DX: Vitamin D deficiency, unspecified: E55.9

## 2014-05-22 MED ORDER — ALPRAZOLAM 0.5 MG PO TABS: 0.5000 mg | ORAL_TABLET | Freq: Every evening | ORAL | Status: DC | PRN

## 2014-05-22 NOTE — Assessment & Plan Note (Addendum)
Since the last time she was here, she took Xanax with good results, she is concerned about addiction to such medication, she is low risk. The patient is counseled today. Xanax is refilled. We talk about SSRIs such as Prozac, she is interested, side effects discussed, she will call when/if ready to take SSRIs. Recommend to see a counselor.

## 2014-05-22 NOTE — Assessment & Plan Note (Signed)
Status post ergocalciferol, recommend OTC vitamin D.

## 2014-05-22 NOTE — Progress Notes (Signed)
   Subjective:    Patient ID: Cassidy Shepard, female    DOB: 01-08-1971, 43 y.o.   MRN: [id number]  DOS:  05/22/2014 Type of visit - description: Followup from previous visit History: Since her last visit was here, she took Xanax, usually half or one tablet helps but she has not been taking it daily and is again having a difficult time staying asleep. Still having some anxiety and depression, denies any suicidal ideas, again symptoms related to being in the process of separation from her husband. Her 2 children are currently in [place]. History of vitamin D deficiency, status post ergocalciferol   ROS Is trying to be more active, has lost some weight.  Past Medical History  Diagnosis Date  . No active medical problems   . Anxiety and depression, insomnia 10/23/2009  . Unspecified vitamin D deficiency 05/22/2014    Past Surgical History  Procedure Laterality Date  . Appendectomy    . Cholecystectomy    . Cesarean section      x 2  . Intrauterine device insertion  2006    History   Social History  . Marital Status: Married    Spouse Name: N/A    Number of Children: 2  . Years of Education: N/A   Occupational History  . works for the city of [place]  . Smoking status: Current Some Day Smoker  . Smokeless tobacco: Never Used     Comment: uses the E-cigarrets   . Alcohol Use: Yes     Comment: socially   . Drug Use: No  . Sexual Activity: Not on file   Other Topics Concern  . Not on file   Social History Narrative   Married, 2 daughters; original from [place]            Medication List       This list is accurate as of: 05/22/14 10:28 PM.  Always use your most recent med list.               ALPRAZolam 0.5 MG tablet  Commonly known as:  XANAX  Take 1 tablet (0.5 mg total) by mouth at bedtime as needed for sleep.     cholecalciferol 1000 UNITS tablet  Commonly known as:  VITAMIN D  Take 1,000 Units by mouth daily.          Objective:   Physical Exam BP 104/74  Pulse 76  Temp(Src) 98.3 F (36.8 C)  Wt 174 lb (78.926 kg)  SpO2 100%  General -- alert, well-developed, NAD.  Psych-- Cognition and judgment appear intact. Cooperative with normal attention span and concentration.  Emotional during portions of the office visit     Assessment & Plan:    Today , I spent more than  15  min with the patient: >50% of the time counseling regards depression-anxiety- prozac s/e

## 2014-05-22 NOTE — Telephone Encounter (Signed)
Relevant patient education mailed to patient.  

## 2014-05-22 NOTE — Patient Instructions (Signed)
Next visit is for routine check up regards difficulty sleeping in 4 months

## 2014-05-22 NOTE — Progress Notes (Signed)
Pre visit review using our clinic review tool, if applicable. No additional management support is needed unless otherwise documented below in the visit note. 

## 2014-09-18 ENCOUNTER — Telehealth: Payer: Self-pay | Admitting: Internal Medicine

## 2014-09-18 MED ORDER — FLUOXETINE HCL 20 MG PO TABS: 20.0000 mg | ORAL_TABLET | Freq: Every day | ORAL | Status: DC

## 2014-09-18 NOTE — Telephone Encounter (Signed)
Caller name:  Brittaney Relation to pt: self Call back number: [phone] Pharmacy: [facility] on [address]  Reason for call:   Patient states that her and dr Larose Kells had talked about going on anti-depressant medication. Patient states that she would like to be prescribed something for depression.

## 2014-09-18 NOTE — Telephone Encounter (Signed)
prozac 20 mg #30  1 po qd ----  F/u 3-4 weeks with Dr Larose Kells

## 2014-09-18 NOTE — Telephone Encounter (Signed)
Spoke with Pt, informed her that Dr. Larose Kells is out of the country for the remainder of the week, however, Dr. Etter Sjogren has prescribed Prozac 20 mg 1 tablet daily. Informed Pt that medication has been sent to [facility] as requested. Informed her to make F/U appt in 3-4 weeks with Dr. Larose Kells. Pt verbalized understanding and stated she would call back if any side effects and when ready to schedule appt.

## 2014-09-18 NOTE — Telephone Encounter (Signed)
Pts last appt was 05/22/2014 regarding anxiety, depression, and insomnia. Pt currently takes Xanax 0.5 mg 1 tablet at bedtime PRN. Pt spoke with Dr. Larose Kells about possibly starting SSRI such as Prozac at this visit. Per OV notes Pt was to call if/when she was ready to begin taking SSRI. Please advise.

## 2015-02-23 ENCOUNTER — Encounter: Payer: Self-pay | Admitting: Internal Medicine

## 2015-02-23 ENCOUNTER — Ambulatory Visit (INDEPENDENT_AMBULATORY_CARE_PROVIDER_SITE_OTHER): Payer: 59 | Admitting: Internal Medicine

## 2015-02-23 ENCOUNTER — Other Ambulatory Visit: Payer: Self-pay

## 2015-02-23 VITALS — BP 118/68 | HR 69 | Temp 98.0°F | Ht 63.0 in | Wt 170.1 lb

## 2015-02-23 DIAGNOSIS — Z0000 Encounter for general adult medical examination without abnormal findings: Secondary | ICD-10-CM

## 2015-02-23 DIAGNOSIS — E611 Iron deficiency: Secondary | ICD-10-CM

## 2015-02-23 DIAGNOSIS — F419 Anxiety disorder, unspecified: Secondary | ICD-10-CM

## 2015-02-23 DIAGNOSIS — F32A Depression, unspecified: Secondary | ICD-10-CM

## 2015-02-23 DIAGNOSIS — F329 Major depressive disorder, single episode, unspecified: Secondary | ICD-10-CM

## 2015-02-23 LAB — BASIC METABOLIC PANEL
BUN: 9 mg/dL (ref 6–23)
CO2: 26 mEq/L (ref 19–32)
Calcium: 9 mg/dL (ref 8.4–10.5)
Chloride: 106 mEq/L (ref 96–112)
Creatinine, Ser: 0.68 mg/dL (ref 0.40–1.20)
GFR: 100.21 mL/min (ref >60.00)
GLUCOSE: 87 mg/dL (ref 70–99)
POTASSIUM: 4 meq/L (ref 3.5–5.1)
SODIUM: 136 meq/L (ref 135–145)

## 2015-02-23 LAB — LIPID PANEL
Cholesterol: 149 mg/dL (ref 0–200)
HDL: 41.1 mg/dL (ref >39.00)
LDL Cholesterol: 92 mg/dL (ref 0–99)
NonHDL: 107.9
TRIGLYCERIDES: 82 mg/dL (ref 0.0–149.0)
Total CHOL/HDL Ratio: 4
VLDL: 16.4 mg/dL (ref 0.0–40.0)

## 2015-02-23 LAB — CBC WITH DIFFERENTIAL/PLATELET
BASOS PCT: 0.5 % (ref 0.0–3.0)
Basophils Absolute: 0 10*3/uL (ref 0.0–0.1)
Eosinophils Absolute: 0.2 10*3/uL (ref 0.0–0.7)
Eosinophils Relative: 2.7 % (ref 0.0–5.0)
HCT: 36.9 % (ref 36.0–46.0)
HEMOGLOBIN: 12.4 g/dL (ref 12.0–15.0)
LYMPHS PCT: 23.5 % (ref 12.0–46.0)
Lymphs Abs: 2 10*3/uL (ref 0.7–4.0)
MCHC: 33.7 g/dL (ref 30.0–36.0)
MCV: 85.9 fl (ref 78.0–100.0)
MONOS PCT: 9.1 % (ref 3.0–12.0)
Monocytes Absolute: 0.8 10*3/uL (ref 0.1–1.0)
NEUTROS ABS: 5.6 10*3/uL (ref 1.4–7.7)
Neutrophils Relative %: 64.2 % (ref 43.0–77.0)
Platelets: 324 10*3/uL (ref 150.0–400.0)
RBC: 4.3 Mil/uL (ref 3.87–5.11)
RDW: 14.4 % (ref 11.5–15.5)
WBC: 8.7 10*3/uL (ref 4.0–10.5)

## 2015-02-23 LAB — FERRITIN: FERRITIN: 23.8 ng/mL (ref 10.0–291.0)

## 2015-02-23 LAB — VITAMIN D 25 HYDROXY (VIT D DEFICIENCY, FRACTURES): VITD: 10.39 ng/mL — AB (ref 30.00–100.00)

## 2015-02-23 LAB — TSH: TSH: 1.43 u[IU]/mL (ref 0.35–4.50)

## 2015-02-23 LAB — IRON: Iron: 79 ug/dL (ref 42–145)

## 2015-02-23 MED ORDER — ALPRAZOLAM 0.5 MG PO TABS: 0.5000 mg | ORAL_TABLET | Freq: Every evening | ORAL | Status: DC | PRN

## 2015-02-23 NOTE — Assessment & Plan Note (Addendum)
Since the last visit, she is doing better, separated from her husband, has her own place. Tried Prozac for 2 weeks and simply stopped, no side effects. Still needs xanax needed--->  refill Xanax, get a UDS, follow-up 6 months

## 2015-02-23 NOTE — Patient Instructions (Signed)
Get your blood work before you leave    Come back to the office in 6 months   for a routine check up

## 2015-02-23 NOTE — Assessment & Plan Note (Addendum)
Td 09-2013 per pt @ gyn office   Sees gyn for female care Dr Dellis Filbert; due to see her, she is overdue to change her IUD Life style discussed Labs  CBC, iron, ferritin, FLP, TSH , vitamin D (not taking vitamin D supplements)

## 2015-02-23 NOTE — Progress Notes (Signed)
Subjective:    Patient ID: Cassidy Shepard, female    DOB: 1971/07/16, 44 y.o.   MRN: [id number]  DOS:  02/23/2015 Type of visit - description : cpx Interval history: In general feeling well   Review of Systems Constitutional: No fever, chills. No unexplained wt changes. No unusual sweats. Mild cold intolerance HEENT: No dental problems, ear discharge, facial swelling, voice changes. No eye discharge, redness or intolerance to light Respiratory: No wheezing or difficulty breathing. No cough , mucus production Cardiovascular: No CP, leg swelling or palpitations GI: no nausea, vomiting, diarrhea or abdominal pain.  No blood in the stools. No dysphagia   Endocrine: No polyphagia, polyuria or polydipsia GU: No dysuria, gross hematuria, difficulty urinating. No urinary urgency or frequency. Musculoskeletal: No joint swellings or unusual aches or pains Skin: No change in the color of the skin, palor or rash Allergic, immunologic: No environmental allergies or food allergies Neurological: No dizziness or syncope. No headaches. No diplopia, slurred speech, motor deficits, facial numbness Hematological: No enlarged lymph nodes, easy bruising or bleeding Psychiatry: No suicidal ideas, hallucinations, behavior problems or confusion.  Since the last time I saw her, she separated from husband, feels better, has her own apartment, still needs Xanax as needed. Not taking Prozac.  Past Medical History  Diagnosis Date  . Anxiety and depression, insomnia 10/23/2009  . Unspecified vitamin D deficiency 05/22/2014    Past Surgical History  Procedure Laterality Date  . Appendectomy    . Cholecystectomy    . Cesarean section      x 2  . Intrauterine device insertion  2006    IUD    History   Social History  . Marital Status: Married    Spouse Name: N/A  . Number of Children: 2  . Years of Education: N/A   Occupational History  . works for the city of [place]  .  Smoking status: Former Research scientist (life sciences)  . Smokeless tobacco: Never Used  . Alcohol Use: 0.0 oz/week    0 Standard drinks or equivalent per week     Comment: socially   . Drug Use: No  . Sexual Activity: Not on file   Other Topics Concern  . Not on file   Social History Narrative   Married, separated , moved to her own place ~ 11-2014   2 daughters 26 and 44 y/o original from [place]            Medication List       This list is accurate as of: 02/23/15 11:59 PM.  Always use your most recent med list.               ALPRAZolam 0.5 MG tablet  Commonly known as:  XANAX  Take 1 tablet (0.5 mg total) by mouth at bedtime as needed for sleep.     cholecalciferol 1000 UNITS tablet  Commonly known as:  VITAMIN D  Take 1,000 Units by mouth daily.       Family History  Problem Relation Age of Onset  . Colon cancer Neg Hx   . Breast cancer Neg Hx   . Diabetes Other     GF  . CAD Neg Hx         Objective:   Physical Exam BP 118/68 mmHg  Pulse 69  Temp(Src) 98 F (36.7 C) (Oral)  Ht 5\' 3"  (1.6 m)  Wt 170 lb 2 oz (77.168 kg)  BMI 30.14 kg/m2  SpO2 97%  LMP 01/29/2015 (Exact Date)  General:   Well developed, well nourished . NAD.  Neck:  Full range of motion. Supple. No  Thyromegaly  HEENT:  Normocephalic . Face symmetric, atraumatic Lungs:  CTA B Normal respiratory effort, no intercostal retractions, no accessory muscle use. Heart: RRR,  no murmur.  No pretibial edema bilaterally  Abdomen:  Not distended, soft, non-tender. No rebound or rigidity. No mass,organomegaly Skin: Exposed areas without rash. Not pale. Not jaundice Neurologic:  alert & oriented X3.  Speech normal, gait appropriate for age and unassisted Strength symmetric and appropriate for age.  Psych: Cognition and judgment appear intact.  Cooperative with normal attention span and concentration.  Behavior appropriate. No anxious or depressed appearing.       Assessment & Plan:

## 2015-02-23 NOTE — Progress Notes (Signed)
Pre visit review using our clinic review tool, if applicable. No additional management support is needed unless otherwise documented below in the visit note. 

## 2015-02-23 NOTE — Assessment & Plan Note (Signed)
We are checking a hemoglobin, iron and ferritin.  her periods are more frequent than before, every 3 weeks, they  last 5 days.

## 2015-02-27 MED ORDER — VITAMIN D (ERGOCALCIFEROL) 1.25 MG (50000 UNIT) PO CAPS: 50000.0000 [IU] | ORAL_CAPSULE | ORAL | Status: DC

## 2015-02-27 NOTE — Addendum Note (Signed)
Addended by: Wilfrid Lund on: 02/27/2015 01:42 PM   Modules accepted: Orders

## 2015-03-09 ENCOUNTER — Telehealth: Payer: Self-pay

## 2015-03-09 NOTE — Telephone Encounter (Signed)
UDS: 02/23/2015  Negative for Alprazolam: PRN   Low risk per Dr. Larose Kells 03/09/2015

## 2015-04-20 HISTORY — PX: INTRAUTERINE DEVICE INSERTION: SHX323

## 2015-04-20 LAB — HM MAMMOGRAPHY: HM Mammogram: NORMAL (ref 0–4)

## 2015-04-20 LAB — HM PAP SMEAR: HM Pap smear: NORMAL

## 2015-05-16 ENCOUNTER — Telehealth: Payer: Self-pay | Admitting: Internal Medicine

## 2015-05-16 NOTE — Telephone Encounter (Signed)
Caller name:Maybelle Relationship to patient:self Can be reached: Pharmacy:  Reason for call:She wants a copy of her results from her cpe labs  From march  Please mail it to  her

## 2015-05-16 NOTE — Telephone Encounter (Signed)
Results from Pt's CPE in May 2016 printed and placed in mail as requested.

## 2015-09-12 ENCOUNTER — Other Ambulatory Visit: Payer: Self-pay | Admitting: Internal Medicine

## 2015-09-12 NOTE — Telephone Encounter (Signed)
Okay to refill #30 and 2 RFs

## 2015-09-12 NOTE — Telephone Encounter (Signed)
Rx faxed to [facility] pharmacy.  

## 2015-09-12 NOTE — Telephone Encounter (Signed)
Pt is requesting refill on Alprazolam.  Last OV: 02/23/2015, no future appt scheduled Last Fill: 02/23/2015 #30 and 4RF UDS: 02/23/2015 Low risk  Please advise.

## 2015-11-22 ENCOUNTER — Encounter: Payer: Self-pay | Admitting: Internal Medicine

## 2016-01-20 ENCOUNTER — Other Ambulatory Visit: Payer: Self-pay | Admitting: Internal Medicine

## 2016-01-21 NOTE — Telephone Encounter (Signed)
Okay #30 and one refill 

## 2016-01-21 NOTE — Telephone Encounter (Signed)
Rx printed, awaiting MD signature.  

## 2016-01-21 NOTE — Telephone Encounter (Signed)
Rx faxed to [facility] pharmacy.  

## 2016-01-21 NOTE — Telephone Encounter (Signed)
Pt is requesting refill on Alprazolam.  Last OV: 02/23/2015 Last Fill: 09/12/2015 #30 and 2RF UDS: 02/23/2015 Low risk  Please advise.

## 2016-01-31 ENCOUNTER — Other Ambulatory Visit: Payer: Self-pay

## 2016-02-20 HISTORY — PX: SKIN SURGERY: SHX2413

## 2016-02-25 ENCOUNTER — Encounter: Payer: Self-pay | Admitting: Behavioral Health

## 2016-02-25 ENCOUNTER — Telehealth: Payer: Self-pay | Admitting: Behavioral Health

## 2016-02-25 NOTE — Telephone Encounter (Signed)
Pre-Visit Call completed with patient and chart updated.   Pre-Visit Info documented in Specialty Comments under [employer].    

## 2016-02-26 ENCOUNTER — Encounter: Payer: Self-pay | Admitting: Internal Medicine

## 2016-02-26 ENCOUNTER — Ambulatory Visit (INDEPENDENT_AMBULATORY_CARE_PROVIDER_SITE_OTHER): Payer: BLUE CROSS/BLUE SHIELD | Admitting: Internal Medicine

## 2016-02-26 VITALS — BP 118/68 | HR 63 | Temp 98.1°F | Ht 63.0 in | Wt 171.2 lb

## 2016-02-26 DIAGNOSIS — E559 Vitamin D deficiency, unspecified: Secondary | ICD-10-CM

## 2016-02-26 DIAGNOSIS — Z0000 Encounter for general adult medical examination without abnormal findings: Secondary | ICD-10-CM

## 2016-02-26 DIAGNOSIS — E538 Deficiency of other specified B group vitamins: Secondary | ICD-10-CM

## 2016-02-26 DIAGNOSIS — Z09 Encounter for follow-up examination after completed treatment for conditions other than malignant neoplasm: Secondary | ICD-10-CM

## 2016-02-26 NOTE — Progress Notes (Signed)
Subjective:    Patient ID: Cassidy Shepard, female    DOB: [birth year], 45 y.o.   MRN: [id number]  DOS:  02/26/2016 Type of visit - description : CPX Interval history: In general feeling well   Review of Systems Constitutional: No fever. No chills. No unexplained wt changes. No unusual sweats  HEENT: No dental problems, no ear discharge, no facial swelling, no voice changes. No eye discharge, no eye  redness , no  intolerance to light   Respiratory: No wheezing , no  difficulty breathing. No cough , no mucus production  Cardiovascular: No CP, no leg swelling , no  Palpitations  GI: no nausea, no vomiting, no diarrhea , no  abdominal pain.  No blood in the stools. No dysphagia, no odynophagia    Endocrine: No polyphagia, no polyuria , no polydipsia. Some cold intolerance  GU: No dysuria, gross hematuria, difficulty urinating. No urinary urgency, no frequency.  Musculoskeletal: No joint swellings or unusual aches or pains  Skin: recently dx with an atypical mole, follow-up by dermatology    Allergic, immunologic: No environmental allergies , no  food allergies  Neurological: No dizziness no  syncope. No headaches. No diplopia, no slurred, no slurred speech, no motor deficits, no facial  Numbness  Hematological: No enlarged lymph nodes, no easy bruising , no unusual bleedings  Psychiatry: No suicidal ideas, no hallucinations, no beavior problems, no confusion.  Insomnia: On Xanax, not as effective as before. Still separated from her husband, some stress.   Past Medical History  Diagnosis Date  . Anxiety and depression, insomnia 10/23/2009  . Unspecified vitamin D deficiency 05/22/2014    Past Surgical History  Procedure Laterality Date  . Appendectomy    . Cholecystectomy    . Cesarean section      x 2  . Intrauterine device insertion  july 2016  . Skin surgery  02/20/16    Social History   Social History  . Marital Status: Married    Spouse Name: N/A  . Number of  Children: 2  . Years of Education: N/A   Occupational History  . works for the city of [place]  . Smoking status: Former Research scientist (life sciences)  . Smokeless tobacco: Never Used  . Alcohol Use: 0.0 oz/week    0 Standard drinks or equivalent per week     Comment: socially   . Drug Use: No  . Sexual Activity: Not on file   Other Topics Concern  . Not on file   Social History Narrative   Married, separated , moved to her own place ~ 11-2014   2 daughters 2001, 2005    original from [place]        / Family History  Problem Relation Age of Onset  . Colon cancer Neg Hx   . Breast cancer Neg Hx   . Diabetes Other     GF  . CAD Neg Hx   . Skin cancer Neg Hx        Medication List       This list is accurate as of: 02/26/16  7:14 PM.  Always use your most recent med list.               ALPRAZolam 0.5 MG tablet  Commonly known as:  XANAX  Take 1 tablet (0.5 mg total) by mouth at bedtime as needed for sleep.           Objective:   Physical  Exam BP 118/68 mmHg  Pulse 63  Temp(Src) 98.1 F (36.7 C) (Oral)  Ht 5\' 3"  (1.6 m)  Wt 171 lb 4 oz (77.678 kg)  BMI 30.34 kg/m2  SpO2 99%  LMP 02/17/2016 (Exact Date)  General:   Well developed, well nourished . NAD.  Neck: No  Thyromegaly  HEENT:  Normocephalic . Face symmetric, atraumatic. Lungs:  CTA B Normal respiratory effort, no intercostal retractions, no accessory muscle use. Heart: RRR,  no murmur.  No pretibial edema bilaterally  Abdomen:  Not distended, soft, non-tender. No rebound or rigidity.   Skin: Exposed areas without rash. Not pale. Not jaundice Neurologic:  alert & oriented X3.  Speech normal, gait appropriate for age and unassisted Strength symmetric and appropriate for age.  Psych: Cognition and judgment appear intact.  Cooperative with normal attention span and concentration.  Behavior appropriate. No anxious or depressed appearing.    Assessment & Plan:    Assessment Anxiety, depression, insomnia Vitamin D deficiency Atypical mole, chest 02-2016 Birth control -- IUD  Plan: Anxiety depression insomnia: Continue with Xanax, is not as effective as before,  tips for healthy sleep provided, okay to use also melatonin. Still has some depression, separated from husband, encouraged to see a counselor. Vitamin D deficiency: Recommend OTC dailies, check labs. Cold intolerance: Checking a TSH RTC one year

## 2016-02-26 NOTE — Progress Notes (Signed)
Pre visit review using our clinic review tool, if applicable. No additional management support is needed unless otherwise documented below in the visit note. 

## 2016-02-26 NOTE — Assessment & Plan Note (Addendum)
Td 09-2013 per pt @ gyn office   Sees gyn for female care Dr Dellis Filbert  Life style discussed. Labs : AST, ALT, vitamin D, B12, CBC, TSH. Declined HIV: Done at gynecology per pt

## 2016-02-26 NOTE — Assessment & Plan Note (Signed)
Anxiety depression insomnia: Continue with Xanax, is not as effective as before,  tips for healthy sleep provided, okay to use also melatonin. Still has some depression, separated from husband, encouraged to see a counselor. Vitamin D deficiency: Recommend OTC dailies, check labs. Cold intolerance: Checking a TSH RTC one year

## 2016-02-26 NOTE — Patient Instructions (Signed)
GO TO THE LAB : Get the blood work  , we also need a urine sample   GO TO THE FRONT DESK Schedule your next appointment for a  yearly checkup in one year, fasting  Continue with  Xanax as needed  Also consider take melatonin OTC 1 tablet at night as needed   Take over-the-counter vitamin D between 600 units an 1000 units every day

## 2016-02-27 ENCOUNTER — Other Ambulatory Visit: Payer: BLUE CROSS/BLUE SHIELD

## 2016-02-27 LAB — CBC WITH DIFFERENTIAL/PLATELET
BASOS ABS: 0.1 10*3/uL (ref 0.0–0.1)
Basophils Relative: 0.6 % (ref 0.0–3.0)
EOS ABS: 0.4 10*3/uL (ref 0.0–0.7)
Eosinophils Relative: 4 % (ref 0.0–5.0)
HEMATOCRIT: 35.8 % — AB (ref 36.0–46.0)
HEMOGLOBIN: 12.1 g/dL (ref 12.0–15.0)
LYMPHS PCT: 25.8 % (ref 12.0–46.0)
Lymphs Abs: 2.6 10*3/uL (ref 0.7–4.0)
MCHC: 33.9 g/dL (ref 30.0–36.0)
MCV: 85.4 fl (ref 78.0–100.0)
Monocytes Absolute: 0.7 10*3/uL (ref 0.1–1.0)
Monocytes Relative: 7.5 % (ref 3.0–12.0)
Neutro Abs: 6.2 10*3/uL (ref 1.4–7.7)
Neutrophils Relative %: 62.1 % (ref 43.0–77.0)
Platelets: 362 10*3/uL (ref 150.0–400.0)
RBC: 4.19 Mil/uL (ref 3.87–5.11)
RDW: 13.7 % (ref 11.5–15.5)
WBC: 10 10*3/uL (ref 4.0–10.5)

## 2016-02-27 LAB — AST: AST: 10 U/L (ref 0–37)

## 2016-02-27 LAB — TSH: TSH: 2.09 u[IU]/mL (ref 0.35–4.50)

## 2016-02-27 LAB — ALT: ALT: 9 U/L (ref 0–35)

## 2016-02-27 LAB — VITAMIN B12: Vitamin B-12: 203 pg/mL — ABNORMAL LOW (ref 211–911)

## 2016-03-01 LAB — VITAMIN D 1,25 DIHYDROXY
VITAMIN D2 1, 25 (OH): 53 pg/mL
VITAMIN D3 1, 25 (OH): 28 pg/mL
Vitamin D 1, 25 (OH)2 Total: 81 pg/mL — ABNORMAL HIGH (ref 18–72)

## 2016-03-06 NOTE — Addendum Note (Signed)
Addended byDamita Dunnings D on: 03/06/2016 10:20 AM   Modules accepted: Orders

## 2016-03-11 ENCOUNTER — Telehealth: Payer: Self-pay | Admitting: Internal Medicine

## 2016-03-11 NOTE — Telephone Encounter (Signed)
Relation to [id number] Call back [place]:  Reason for call:  atient inquiring about lab results and would like to speak with nurse directly.

## 2016-03-11 NOTE — Telephone Encounter (Signed)
Informed patient of message below, patient would like to speak with nurse directly.

## 2016-03-11 NOTE — Telephone Encounter (Signed)
Notes Recorded by Damita Dunnings, CMA on 03/06/2016 at 10:19 AM Letter printed and mailed to Pt, instructed her to call and schedule lab appt to be completed in 3 months to recheck her B12 levels. Notes Recorded by Damita Dunnings, CMA on 03/05/2016 at 10:28 AM [facility] informing Pt to return call regarding lab results. Notes Recorded by Colon Branch, MD on 03/04/2016 at 6:04 PM Call the patient: B12 is low, recommend a OTC B12 supplements every day. Vitamin D a little high, decrease vitamin D supplements to half of what she is taking. RTC for labs only 3 months to recheck a B12, please arrange. If not better consider shots.

## 2016-03-12 NOTE — Telephone Encounter (Signed)
Spoke w/ Pt, informed her of lab results, instructed her to start B12 OTC, she states she already takes 923mcg in her multivitamin daily, instructed to continue multivitamin and try picking up B12 around 500 mcg and we would recheck in 3 months. Pt has scheduled lab appt for 06/17/2016 at 0800.

## 2016-03-25 ENCOUNTER — Emergency Department (HOSPITAL_COMMUNITY)
Admission: EM | Admit: 2016-03-25 | Discharge: 2016-03-26 | Disposition: A | Discharge status: Home / Self Care | Payer: BLUE CROSS/BLUE SHIELD | Attending: Emergency Medicine | Admitting: Emergency Medicine

## 2016-03-25 ENCOUNTER — Encounter: Payer: Self-pay | Admitting: Internal Medicine

## 2016-03-25 ENCOUNTER — Encounter (HOSPITAL_COMMUNITY): Payer: Self-pay

## 2016-03-25 DIAGNOSIS — N390 Urinary tract infection, site not specified: Secondary | ICD-10-CM | POA: Diagnosis not present

## 2016-03-25 DIAGNOSIS — F329 Major depressive disorder, single episode, unspecified: Secondary | ICD-10-CM | POA: Insufficient documentation

## 2016-03-25 DIAGNOSIS — I10 Essential (primary) hypertension: Secondary | ICD-10-CM | POA: Diagnosis present

## 2016-03-25 DIAGNOSIS — R21 Rash and other nonspecific skin eruption: Secondary | ICD-10-CM | POA: Diagnosis not present

## 2016-03-25 DIAGNOSIS — H81399 Other peripheral vertigo, unspecified ear: Secondary | ICD-10-CM | POA: Diagnosis not present

## 2016-03-25 LAB — CBC WITH DIFFERENTIAL/PLATELET
BASOS ABS: 0 10*3/uL (ref 0.0–0.1)
BASOS PCT: 0 %
Eosinophils Absolute: 0.2 10*3/uL (ref 0.0–0.7)
Eosinophils Relative: 2 %
HEMATOCRIT: 37.2 % (ref 36.0–46.0)
HEMOGLOBIN: 12.2 g/dL (ref 12.0–15.0)
LYMPHS PCT: 22 %
Lymphs Abs: 2.2 10*3/uL (ref 0.7–4.0)
MCH: 28.5 pg (ref 26.0–34.0)
MCHC: 32.8 g/dL (ref 30.0–36.0)
MCV: 86.9 fL (ref 78.0–100.0)
MONO ABS: 0.7 10*3/uL (ref 0.1–1.0)
MONOS PCT: 7 %
NEUTROS ABS: 6.7 10*3/uL (ref 1.7–7.7)
NEUTROS PCT: 69 %
Platelets: 358 10*3/uL (ref 150–400)
RBC: 4.28 MIL/uL (ref 3.87–5.11)
RDW: 13.3 % (ref 11.5–15.5)
WBC: 9.9 10*3/uL (ref 4.0–10.5)

## 2016-03-25 LAB — I-STAT TROPONIN, ED: TROPONIN I, POC: 0 ng/mL (ref 0.00–0.08)

## 2016-03-25 LAB — PREGNANCY, URINE: Preg Test, Ur: NEGATIVE

## 2016-03-25 MED ORDER — MECLIZINE HCL 25 MG PO TABS
25.0000 mg | ORAL_TABLET | Freq: Once | ORAL | Status: AC
  Administered 2016-03-25: 25 mg via ORAL
  Filled 2016-03-25: qty 1

## 2016-03-25 NOTE — ED Provider Notes (Signed)
CSN: [id number]     Arrival date & time 03/25/16  2038 History  By signing my name below, I, Cassidy Shepard, attest that this documentation has been prepared under the direction and in the presence of Shanon Rosser, MD. Electronically Signed: Georgette Shepard, ED Scribe. 03/25/2016. 11:13 PM.   Chief Complaint  Patient presents with  . Hypertension   The history is provided by the patient. No language interpreter was used.    HPI Comments: Cassidy Shepard is a 45 y.o. female who presents to the Emergency Department complaining of intermittent headaches onset today. Patient also has associated "room-spinning" dizziness and lightheadedness which began around 6:30pm today while she was driving. Patient states that the dizziness is exacerbated with sudden movement. She also has associated intermittent chest pain onset around 8:30pm, she describes it as feeling like something was stuck in her throat. Patient stopped at a drug store, and found her blood pressure to be 147/94 around 7:30pm. She checked her blood pressure 20 minutes later and found out it was 156/99. Patient then went to another drugstore and checked it again, finding it to be 167/106 and decided to come here. Patient also reports she woke up with a pruritic rash to her forearms, neck and abdomen, for which she took Claritin and had relief of itching but not the rash. Patient denies nausea, shortness of breath and ringing of the ears.  Past Medical History  Diagnosis Date  . Anxiety and depression, insomnia 10/23/2009  . Unspecified vitamin D deficiency 05/22/2014   Past Surgical History  Procedure Laterality Date  . Appendectomy    . Cholecystectomy    . Cesarean section      x 2  . Intrauterine device insertion  july 2016  . Skin surgery  02/20/16   Family History  Problem Relation Age of Onset  . Colon cancer Neg Hx   . Breast cancer Neg Hx   . Diabetes Other     GF  . CAD Neg Hx   . Skin cancer Neg Hx    Social History  Substance Use Topics  .  Smoking status: Former Research scientist (life sciences)  . Smokeless tobacco: Never Used  . Alcohol Use: 0.0 oz/week    0 Standard drinks or equivalent per week     Comment: socially    OB History    No data available     Review of Systems  A complete 10 system review of systems was obtained and all systems are negative except as noted in the HPI and PMH.   Allergies  Sulfonamide derivatives  Home Medications   Prior to Admission medications   Medication Sig Start Date End Date Taking? Authorizing Provider  ALPRAZolam Duanne Moron) 0.5 MG tablet Take 1 tablet (0.5 mg total) by mouth at bedtime as needed for sleep. 01/21/16  Yes Colon Branch, MD  Cyanocobalamin (VITAMIN B 12 PO) Take 3,000 mg by mouth every morning.   Yes Historical Provider, MD   BP 122/90 mmHg  Pulse 65  Temp(Src) 98.5 F (36.9 C) (Oral)  Resp 16  SpO2 100%  LMP 03/15/2016   Physical Exam  General: Well-developed, well-nourished female in no acute distress; appearance consistent with age of record HENT: normocephalic; atraumatic Eyes: pupils equal, round and reactive to light; extraocular muscles intact; No nystagmus Neck: supple Heart: regular rate and rhythm Lungs: clear to auscultation bilaterally Abdomen: soft; nondistended; nontender; no masses or hepatosplenomegaly; bowel sounds present Extremities: No deformity; full range of motion; pulses normal Neurologic: Awake,  alert and oriented; motor function intact in all extremities and symmetric; no facial droop; normal coordination and speech Skin: Warm and dry; fine macular papular rash prominent on neck, abdomen and antecubital fossae  Psychiatric: Normal mood and affect   ED Course  Procedures (including critical care time)   MDM   Nursing notes and vitals signs, including pulse oximetry, reviewed.  Summary of this visit's results, reviewed by myself:   EKG Interpretation  Date/Time:  Tuesday March 25 2016 23:53:34 EDT Ventricular Rate:  69 PR Interval:  164 QRS  Duration: 103 QT Interval:  396 QTC Calculation: 424 R Axis:   83 Text Interpretation:  Sinus rhythm Normal ECG No previous ECGs available Confirmed by Mahathi Pokorney  MD, Jenny Reichmann ([id number]) on 03/26/2016 12:06:26 AM       Labs:  Results for orders placed or performed during the hospital encounter of 03/25/16 (from the past 24 hour(s))  CBC with Differential/Platelet     Status: None   Collection Time: 03/25/16 11:26 PM  Result Value Ref Range   WBC 9.9 4.0 - 10.5 K/uL   RBC 4.28 3.87 - 5.11 MIL/uL   Hemoglobin 12.2 12.0 - 15.0 g/dL   HCT 37.2 36.0 - 46.0 %   MCV 86.9 78.0 - 100.0 fL   MCH 28.5 26.0 - 34.0 pg   MCHC 32.8 30.0 - 36.0 g/dL   RDW 13.3 11.5 - 15.5 %   Platelets 358 150 - 400 K/uL   Neutrophils Relative % 69 %   Neutro Abs 6.7 1.7 - 7.7 K/uL   Lymphocytes Relative 22 %   Lymphs Abs 2.2 0.7 - 4.0 K/uL   Monocytes Relative 7 %   Monocytes Absolute 0.7 0.1 - 1.0 K/uL   Eosinophils Relative 2 %   Eosinophils Absolute 0.2 0.0 - 0.7 K/uL   Basophils Relative 0 %   Basophils Absolute 0.0 0.0 - 0.1 K/uL  Basic metabolic panel     Status: Abnormal   Collection Time: 03/25/16 11:26 PM  Result Value Ref Range   Sodium 139 135 - 145 mmol/L   Potassium 3.7 3.5 - 5.1 mmol/L   Chloride 105 101 - 111 mmol/L   CO2 26 22 - 32 mmol/L   Glucose, Bld 105 (H) 65 - 99 mg/dL   BUN 9 6 - 20 mg/dL   Creatinine, Ser 0.61 0.44 - 1.00 mg/dL   Calcium 9.2 8.9 - 10.3 mg/dL   GFR calc non Af Amer >60 >60 mL/min   GFR calc Af Amer >60 >60 mL/min   Anion gap 8 5 - 15  I-stat troponin, ED     Status: None   Collection Time: 03/25/16 11:33 PM  Result Value Ref Range   Troponin i, poc 0.00 0.00 - 0.08 ng/mL   Comment 3          Urinalysis, Routine w reflex microscopic (not at [facility])     Status: Abnormal   Collection Time: 03/25/16 11:38 PM  Result Value Ref Range   Color, Urine YELLOW YELLOW   APPearance CLOUDY (A) CLEAR   Specific Gravity, Urine 1.003 (L) 1.005 - 1.030   pH 6.5 5.0 - 8.0   Glucose,  UA NEGATIVE NEGATIVE mg/dL   Hgb urine dipstick SMALL (A) NEGATIVE   Bilirubin Urine NEGATIVE NEGATIVE   Ketones, ur NEGATIVE NEGATIVE mg/dL   Protein, ur NEGATIVE NEGATIVE mg/dL   Nitrite NEGATIVE NEGATIVE   Leukocytes, UA TRACE (A) NEGATIVE  Pregnancy, urine     Status: None  Collection Time: 03/25/16 11:38 PM  Result Value Ref Range   Preg Test, Ur NEGATIVE NEGATIVE  Urine microscopic-add on     Status: Abnormal   Collection Time: 03/25/16 11:38 PM  Result Value Ref Range   Squamous Epithelial / LPF 6-30 (A) NONE SEEN   WBC, UA 6-30 0 - 5 WBC/hpf   RBC / HPF 6-30 0 - 5 RBC/hpf   Bacteria, UA FEW (A) NONE SEEN   1:03 AM Vertigo improved after meclizine. Will treat for urinary tract infection. Patient was advised not to repeatedly take her blood pressure as this can actually cause her blood pressure to rise due to anxiety.  Final diagnoses:  Peripheral vertigo, unspecified laterality  UTI (lower urinary tract infection)  Rash   I personally performed the services described in this documentation, which was scribed in my presence. The recorded information has been reviewed and is accurate.   Shanon Rosser, MD 03/26/16 [phone]

## 2016-03-25 NOTE — ED Notes (Signed)
Pt states that she started to feel dizzy, lightheaded and having short of breath and stopped to take her blood pressure at the drug store, she took it three more times today and it was higher each time, she still has the headache ans states she now feels like she ate something hard and it's stuck in her throat

## 2016-03-25 NOTE — ED Notes (Signed)
Urine specimen collected by clean catch.

## 2016-03-26 LAB — BASIC METABOLIC PANEL
ANION GAP: 8 (ref 5–15)
BUN: 9 mg/dL (ref 6–20)
CHLORIDE: 105 mmol/L (ref 101–111)
CO2: 26 mmol/L (ref 22–32)
Calcium: 9.2 mg/dL (ref 8.9–10.3)
Creatinine, Ser: 0.61 mg/dL (ref 0.44–1.00)
GFR calc non Af Amer: >60 mL/min (ref >60)
GLUCOSE: 105 mg/dL — AB (ref 65–99)
Potassium: 3.7 mmol/L (ref 3.5–5.1)
Sodium: 139 mmol/L (ref 135–145)

## 2016-03-26 LAB — URINALYSIS, ROUTINE W REFLEX MICROSCOPIC
Bilirubin Urine: NEGATIVE
GLUCOSE, UA: NEGATIVE mg/dL
Ketones, ur: NEGATIVE mg/dL
Nitrite: NEGATIVE
PH: 6.5 (ref 5.0–8.0)
Protein, ur: NEGATIVE mg/dL
SPECIFIC GRAVITY, URINE: 1.003 — AB (ref 1.005–1.030)

## 2016-03-26 LAB — URINE MICROSCOPIC-ADD ON

## 2016-03-26 MED ORDER — MECLIZINE HCL 25 MG PO TABS: 25.0000 mg | ORAL_TABLET | Freq: Three times a day (TID) | ORAL | Status: DC | PRN

## 2016-03-26 MED ORDER — FOSFOMYCIN TROMETHAMINE 3 G PO PACK
3.0000 g | PACK | Freq: Once | ORAL | Status: AC
  Administered 2016-03-26: 3 g via ORAL
  Filled 2016-03-26: qty 3

## 2016-03-27 LAB — URINE CULTURE

## 2016-03-31 ENCOUNTER — Ambulatory Visit (INDEPENDENT_AMBULATORY_CARE_PROVIDER_SITE_OTHER): Payer: BLUE CROSS/BLUE SHIELD | Admitting: Internal Medicine

## 2016-03-31 ENCOUNTER — Encounter: Payer: Self-pay | Admitting: Internal Medicine

## 2016-03-31 VITALS — BP 122/72 | HR 68 | Temp 97.6°F | Ht 63.0 in | Wt 174.4 lb

## 2016-03-31 DIAGNOSIS — R030 Elevated blood-pressure reading, without diagnosis of hypertension: Secondary | ICD-10-CM | POA: Diagnosis not present

## 2016-03-31 DIAGNOSIS — R42 Dizziness and giddiness: Secondary | ICD-10-CM | POA: Diagnosis not present

## 2016-03-31 DIAGNOSIS — F329 Major depressive disorder, single episode, unspecified: Secondary | ICD-10-CM

## 2016-03-31 DIAGNOSIS — F32A Depression, unspecified: Secondary | ICD-10-CM

## 2016-03-31 DIAGNOSIS — F418 Other specified anxiety disorders: Secondary | ICD-10-CM

## 2016-03-31 DIAGNOSIS — IMO0001 Reserved for inherently not codable concepts without codable children: Secondary | ICD-10-CM

## 2016-03-31 DIAGNOSIS — F419 Anxiety disorder, unspecified: Secondary | ICD-10-CM

## 2016-03-31 MED ORDER — FLUOXETINE HCL 20 MG PO TABS: 20.0000 mg | ORAL_TABLET | Freq: Every day | ORAL | Status: DC

## 2016-03-31 NOTE — Progress Notes (Signed)
Subjective:    Patient ID: Cassidy Shepard, female    DOB: April 08, 1971, 45 y.o.   MRN: [id number]  DOS:  03/31/2016 Type of visit - description : ED f/u Interval history:  ER visit 03/25/2016, chart reviewed: was seen with a headache, dizziness described as the room spinning, acute onset Also had chest pain described as something stuck in her throat. BP was elevated prior to the arrival to the ER. Also had a rash  CBC, BMP within normal, UA c/w UTI , UCX- multiple bacteria EKG nsr --- Today, she confirmed description of her symptoms as reported on the ER note.  In addition, she reports that her job has been very stressful, she is having some financial issues as well. Since she left the hospital, she reports that continue having dizziness, described as lightheadedness, "heavy head", can't describe symptoms any better, they are on and off, no vertigo per se. Denies nausea, vomiting, diarrhea. No fever or chills. Sleeping okay as long as she takes Xanax No depression per se. She also has some headaches, on and off, at both sides of the head. Not the worst of her life. No diplopia,slurred speech or motor deficits  Also her BP has been checked at least twice since the ER visit, one time was normal, yesterday was 165/90 Also had a rash when she went to the ER: Resolved UA was consistent with infection, urine culture contaminated, denies any dysuria, gross hematuria difficulty urinating  BP Readings from Last 3 Encounters:  03/31/16 122/72  03/26/16 104/76  02/26/16 118/68      Review of Systems See above  Past Medical History  Diagnosis Date  . Anxiety and depression, insomnia 10/23/2009  . Unspecified vitamin D deficiency 05/22/2014    Past Surgical History  Procedure Laterality Date  . Appendectomy    . Cholecystectomy    . Cesarean section      x 2  . Intrauterine device insertion  july 2016  . Skin surgery  02/20/16    Social History   Social History  . Marital Status:  Married    Spouse Name: N/A  . Number of Children: 2  . Years of Education: N/A   Occupational History  . works for the city of [place]  . Smoking status: Former Research scientist (life sciences)  . Smokeless tobacco: Never Used  . Alcohol Use: 0.0 oz/week    0 Standard drinks or equivalent per week     Comment: socially   . Drug Use: No  . Sexual Activity: Not on file   Other Topics Concern  . Not on file   Social History Narrative   Married, separated , moved to her own place ~ 11-2014   2 daughters 2001, 2005    original from [place]            Medication List       This list is accurate as of: 03/31/16  5:13 PM.  Always use your most recent med list.               ALPRAZolam 0.5 MG tablet  Commonly known as:  XANAX  Take 1 tablet (0.5 mg total) by mouth at bedtime as needed for sleep.     FLUoxetine 20 MG tablet  Commonly known as:  PROZAC  Take 1 tablet (20 mg total) by mouth daily.     meclizine 25 MG tablet  Commonly known as:  ANTIVERT  Take 1 tablet (25  mg total) by mouth 3 (three) times daily as needed for dizziness.     VITAMIN B 12 PO  Take 3,000 mg by mouth every morning.           Objective:   Physical Exam BP 122/72 mmHg  Pulse 68  Temp(Src) 97.6 F (36.4 C) (Oral)  Ht 5\' 3"  (1.6 m)  Wt 174 lb 6 oz (79.096 kg)  BMI 30.90 kg/m2  SpO2 97%  LMP 03/15/2016 General:   Well developed, well nourished . NAD.  HEENT:  Normocephalic . Face symmetric, atraumatic Lungs:  CTA B Normal respiratory effort, no intercostal retractions, no accessory muscle use. Heart: RRR,  no murmur.  No pretibial edema bilaterally  Skin: Not pale. Not jaundice Neurologic:  alert & oriented X3.  Speech normal, gait appropriate for age and unassisted EOMI, pupils equal and reactive, DTRs symmetric Psych--  Cognition and judgment appear intact.  Cooperative with normal attention span and concentration.  Behavior appropriate. Slightly apprehensive  appearing, no depressed appearing.  Did get emotional at some point during the visit.      Assessment & Plan:   Assessment Anxiety, depression, insomnia Vitamin D deficiency Atypical mole, chest 02-2016 Birth control -- IUD  PLAN: Dizziness, headache: Since the ER visit continue with a ill-defined dizziness, no clear-cut vertigo. To be sure we'll get a CT of the head. Elevated BP: BP was elevated prior to the ER visit and one time since. BP today normal. Recommend to continue checking.  Anxiety depression and insomnia: More stress at work lately, some financial issues. I believe stress is playing a role her symptoms and elevated BP, she agrees with me. We again discussed the benefits of daily treatment with SSRIs and she   would like to try. Plan: Start fluoxetine, follow-up in one month. Also plans to see a counselor which I agree with. UTI?: No symptoms, urine culture negative RTC one month

## 2016-03-31 NOTE — Patient Instructions (Signed)
We are ordering a cat scan  Start fluoxetine 20 mg: 1/2 tablet a day x 10 days, then 1 tablet a day  Check the  blood pressure 2 or 3 times a week  Be sure your blood pressure is between 110/65 and  145/85.  if it is consistently higher or lower, let me know   GO TO THE FRONT DESK Schedule your next appointment for a  CHECK UP IN 1 MONTHS    If you have severe headache or dizzines let us know or go to the ER

## 2016-03-31 NOTE — Assessment & Plan Note (Signed)
Dizziness, headache: Since the ER visit continue with a ill-defined dizziness, no clear-cut vertigo. To be sure we'll get a CT of the head. Elevated BP: BP was elevated prior to the ER visit and one time since. BP today normal. Recommend to continue checking.  Anxiety depression and insomnia: More stress at work lately, some financial issues. I believe stress is playing a role her symptoms and elevated BP, she agrees with me. We again discussed the benefits of daily treatment with SSRIs and she   would like to try. Plan: Start fluoxetine, follow-up in one month. Also plans to see a counselor which I agree with. UTI?: No symptoms, urine culture negative RTC one month

## 2016-03-31 NOTE — Progress Notes (Signed)
Pre visit review using our clinic review tool, if applicable. No additional management support is needed unless otherwise documented below in the visit note. 

## 2016-04-03 ENCOUNTER — Ambulatory Visit (HOSPITAL_BASED_OUTPATIENT_CLINIC_OR_DEPARTMENT_OTHER): Payer: BLUE CROSS/BLUE SHIELD

## 2016-04-14 ENCOUNTER — Telehealth: Payer: Self-pay

## 2016-04-14 MED ORDER — ALPRAZOLAM 0.5 MG PO TABS: 0.5000 mg | ORAL_TABLET | Freq: Every evening | ORAL | Status: DC | PRN

## 2016-04-14 NOTE — Telephone Encounter (Signed)
Rx faxed to [facility] pharmacy.  

## 2016-04-14 NOTE — Telephone Encounter (Signed)
Pt is requesting refill on Alprazolam.  Last OV: 03/31/2016 Last Fill: 01/31/2016 #30 and 1RF UDS: 02/23/2015 Low risk  Please advise.

## 2016-04-14 NOTE — Telephone Encounter (Signed)
Okay #30 and one refill 

## 2016-04-14 NOTE — Telephone Encounter (Signed)
Rx printed, awaiting MD signature.  

## 2016-04-17 ENCOUNTER — Telehealth: Payer: Self-pay

## 2016-04-17 NOTE — Telephone Encounter (Signed)
UDS: 02/27/2016  Negative for Alprazolam: PRN (Benzodiazepine detected)   Low risk per Dr. Larose Kells 04/17/2016

## 2016-04-25 ENCOUNTER — Telehealth: Payer: Self-pay | Admitting: Internal Medicine

## 2016-04-25 NOTE — Telephone Encounter (Signed)
Pt has an appt scheduled with Dr. Larose Kells on 04/30/16 at 4 pm.

## 2016-04-25 NOTE — Telephone Encounter (Signed)
Patient Name: Cassidy Shepard  DOB: 06-07-1971    Initial Comment Caller states bp is up, 143/88, feels tired.   Nurse Assessment  Nurse: Mallie Mussel, RN, Alveta Heimlich Date/Time Eilene Ghazi Time): 04/25/2016 2:23:15 PM  Confirm and document reason for call. If symptomatic, describe symptoms. You must click the next button to save text entered. ---Caller states that she feels that her BP is up. This morning, it was 143/88. She has an appointment for Wednesday of next week and wants to know if there is any way of being seen sooner than that. Denies headache. She denies unilateral weakness and numbness. She states that her BP has been higher than this, last night it was 153/98.  Has the patient traveled out of the country within the last 30 days? ---No  Does the patient have any new or worsening symptoms? ---Yes  Will a triage be completed? ---Yes  Related visit to physician within the last 2 weeks? ---No  Does the PT have any chronic conditions? (i.e. diabetes, asthma, etc.) ---Yes  List chronic conditions. ---Depression/Anxiety  Is the patient pregnant or possibly pregnant? (Ask all females between the ages of 44-55) ---No  Is this a behavioral health or substance abuse call? ---No     Guidelines    Guideline Title Affirmed Question Affirmed Notes  High Blood Pressure [id number] Systolic BP >= [id number] OR Diastolic >= 90 AND [id number] not taking BP medications    Final Disposition User   See PCP within [facility], RN, Alveta Heimlich    Disagree/Comply: Leta Baptist

## 2016-04-29 ENCOUNTER — Encounter: Payer: Self-pay | Admitting: Internal Medicine

## 2016-04-30 ENCOUNTER — Encounter: Payer: Self-pay | Admitting: Internal Medicine

## 2016-04-30 ENCOUNTER — Ambulatory Visit (INDEPENDENT_AMBULATORY_CARE_PROVIDER_SITE_OTHER): Payer: BLUE CROSS/BLUE SHIELD | Admitting: Internal Medicine

## 2016-04-30 VITALS — BP 128/82 | HR 72 | Temp 98.4°F | Ht 63.0 in | Wt 167.0 lb

## 2016-04-30 DIAGNOSIS — F418 Other specified anxiety disorders: Secondary | ICD-10-CM | POA: Diagnosis not present

## 2016-04-30 DIAGNOSIS — F32A Depression, unspecified: Secondary | ICD-10-CM

## 2016-04-30 DIAGNOSIS — R030 Elevated blood-pressure reading, without diagnosis of hypertension: Secondary | ICD-10-CM | POA: Diagnosis not present

## 2016-04-30 DIAGNOSIS — F419 Anxiety disorder, unspecified: Principal | ICD-10-CM

## 2016-04-30 DIAGNOSIS — E538 Deficiency of other specified B group vitamins: Secondary | ICD-10-CM

## 2016-04-30 DIAGNOSIS — IMO0001 Reserved for inherently not codable concepts without codable children: Secondary | ICD-10-CM

## 2016-04-30 DIAGNOSIS — F329 Major depressive disorder, single episode, unspecified: Secondary | ICD-10-CM

## 2016-04-30 DIAGNOSIS — R42 Dizziness and giddiness: Secondary | ICD-10-CM | POA: Diagnosis not present

## 2016-04-30 MED ORDER — METOPROLOL TARTRATE 25 MG PO TABS: 25.0000 mg | ORAL_TABLET | Freq: Two times a day (BID) | ORAL | Status: DC

## 2016-04-30 MED ORDER — FLUOXETINE HCL 20 MG PO TABS: 30.0000 mg | ORAL_TABLET | Freq: Every day | ORAL | Status: DC

## 2016-04-30 NOTE — Progress Notes (Signed)
Subjective:    Patient ID: Cassidy Shepard, female    DOB: October 13, 1971, 45 y.o.   MRN: [id number]  DOS:  04/30/2016 Type of visit - description : rov Interval history: Anxiety: started Fluoxetine, no major difference. Headaches, did not pursue a head CT, symptoms are much improved. Chest pain, has occasional minor discomfort on and off Elevated BP: Ambulatory BPs range from 130 to 150. Diastolic BPs 80 to 0000000. States she can "feel" when the BP is high mostly with chest discomfort.    Review of Systems No depression or suicidal ideas. Sleep is okay with alprazolam  Past Medical History  Diagnosis Date  . Anxiety and depression, insomnia 10/23/2009  . Unspecified vitamin D deficiency 05/22/2014    Past Surgical History  Procedure Laterality Date  . Appendectomy    . Cholecystectomy    . Cesarean section      x 2  . Intrauterine device insertion  july 2016  . Skin surgery  02/20/16    Social History   Social History  . Marital Status: Married    Spouse Name: N/A  . Number of Children: 2  . Years of Education: N/A   Occupational History  . works for the city of [place]  . Smoking status: Former Research scientist (life sciences)  . Smokeless tobacco: Never Used  . Alcohol Use: 0.0 oz/week    0 Standard drinks or equivalent per week     Comment: socially   . Drug Use: No  . Sexual Activity: Not on file   Other Topics Concern  . Not on file   Social History Narrative   Married, separated , moved to her own place ~ 11-2014   2 daughters 2001, 2005    original from [place]            Medication List       This list is accurate as of: 04/30/16 11:59 PM.  Always use your most recent med list.               ALPRAZolam 0.5 MG tablet  Commonly known as:  XANAX  Take 1 tablet (0.5 mg total) by mouth at bedtime as needed for sleep.     FLUoxetine 20 MG tablet  Commonly known as:  PROZAC  Take 1.5 tablets (30 mg total) by mouth daily.     meclizine 25 MG  tablet  Commonly known as:  ANTIVERT  Take 1 tablet (25 mg total) by mouth 3 (three) times daily as needed for dizziness.     metoprolol tartrate 25 MG tablet  Commonly known as:  LOPRESSOR  Take 1 tablet (25 mg total) by mouth 2 (two) times daily.     VITAMIN B 12 PO  Take 3,000 mg by mouth every morning.           Objective:   Physical Exam BP 128/82 mmHg  Pulse 72  Temp(Src) 98.4 F (36.9 C) (Oral)  Ht 5\' 3"  (1.6 m)  Wt 167 lb (75.751 kg)  BMI 29.59 kg/m2  SpO2 99%  LMP 04/10/2016 (Exact Date) General:   Well developed, well nourished . NAD.  HEENT:  Normocephalic . Face symmetric, atraumatic Skin: Not pale. Not jaundice Neurologic:  alert & oriented X3.  Speech normal, gait appropriate for age and unassisted. Neck supple. Motor symmetric. Psych--  Cognition and judgment appear intact.  Cooperative with normal attention span and concentration.  Behavior appropriate. No anxious or depressed appearing.  Assessment & Plan:   Assessment Anxiety, depression, insomnia Vitamin D deficiency Atypical mole, chest 02-2016 Birth control -- IUD  PLAN: Dizziness, headache: Improved, did not pursue a CT head. Elevated BP: BP today is very good, at home it has been as high as 150/90. Start  metoprolol 25 mg twice a day, okay to decrease the dose to half if needed. See instructions Anxiety, insomnia: Continue with Xanax for insomnia, started fluoxetine 4 weeks ago, so far no improvement, increase fluoxetine to 30 mg and come back in 6 weeks, encourage counseling. B12 deficiency: On oral supplements, check labs. RTC 6 weeks.  Today, I spent more than   25 min with the patient: >50% of the time counseling regards  anxiety management, listening to her concerns

## 2016-04-30 NOTE — Patient Instructions (Signed)
GO TO THE LAB : Get the blood work     GO TO THE FRONT DESK Schedule your next appointment for a  Check up in 6 weeks    Start  metoprolol 25 mg 1 tab twice a day, if too strong, take only 1/2 tablet twice a day   Check the  blood pressure 2 or 3 times a week Be sure your blood pressure is between 110/65 and  145/85. If it is consistently higher or lower, let me know

## 2016-04-30 NOTE — Progress Notes (Signed)
Pre visit review using our clinic review tool, if applicable. No additional management support is needed unless otherwise documented below in the visit note. 

## 2016-05-01 LAB — VITAMIN B12: VITAMIN B 12: 430 pg/mL (ref 211–911)

## 2016-05-01 NOTE — Assessment & Plan Note (Signed)
Dizziness, headache: Improved, did not pursue a CT head. Elevated BP: BP today is very good, at home it has been as high as 150/90. Start  metoprolol 25 mg twice a day, okay to decrease the dose to half if needed. See instructions Anxiety, insomnia: Continue with Xanax for insomnia, started fluoxetine 4 weeks ago, so far no improvement, increase fluoxetine to 30 mg and come back in 6 weeks, encourage counseling. B12 deficiency: On oral supplements, check labs. RTC 6 weeks.

## 2016-06-11 ENCOUNTER — Ambulatory Visit (INDEPENDENT_AMBULATORY_CARE_PROVIDER_SITE_OTHER): Payer: BLUE CROSS/BLUE SHIELD | Admitting: Internal Medicine

## 2016-06-11 ENCOUNTER — Encounter: Payer: Self-pay | Admitting: Internal Medicine

## 2016-06-11 VITALS — BP 102/62 | HR 58 | Temp 98.0°F | Resp 16 | Ht 63.0 in | Wt 177.0 lb

## 2016-06-11 DIAGNOSIS — G4730 Sleep apnea, unspecified: Secondary | ICD-10-CM

## 2016-06-11 DIAGNOSIS — F32A Depression, unspecified: Secondary | ICD-10-CM

## 2016-06-11 DIAGNOSIS — F418 Other specified anxiety disorders: Secondary | ICD-10-CM

## 2016-06-11 DIAGNOSIS — F329 Major depressive disorder, single episode, unspecified: Secondary | ICD-10-CM

## 2016-06-11 DIAGNOSIS — IMO0001 Reserved for inherently not codable concepts without codable children: Secondary | ICD-10-CM

## 2016-06-11 DIAGNOSIS — R030 Elevated blood-pressure reading, without diagnosis of hypertension: Secondary | ICD-10-CM

## 2016-06-11 DIAGNOSIS — F419 Anxiety disorder, unspecified: Principal | ICD-10-CM

## 2016-06-11 NOTE — Progress Notes (Signed)
Pre visit review using our clinic review tool, if applicable. No additional management support is needed unless otherwise documented below in the visit note. 

## 2016-06-11 NOTE — Patient Instructions (Signed)
Continue fluoxetine 20 mg  Will refer you for a sleep study  Next visit in 3 months

## 2016-06-11 NOTE — Progress Notes (Signed)
Subjective:    Patient ID: Cassidy Shepard, female    DOB: 1971/08/31, 45 y.o.   MRN: [id number]  DOS:  06/11/2016 Type of visit - description : f/u Interval history: Since the last office visit, she increased Flexeril to 30 mg but at some point she felt really fatigued, decrease Flexeril back to 20 mg. Fatigue is now better. I asked her about her mood, she states she feels okay, very hesitant to tell me if she is feeling better or not or if she likes to continue with the medication or not.   Review of Systems Occasional snoring, sleep at times?  Past Medical History:  Diagnosis Date  . Anxiety and depression, insomnia 10/23/2009  . Unspecified vitamin D deficiency 05/22/2014    Past Surgical History:  Procedure Laterality Date  . APPENDECTOMY    . CESAREAN SECTION     x 2  . CHOLECYSTECTOMY    . INTRAUTERINE DEVICE INSERTION  july 2016  . SKIN SURGERY  02/20/16    Social History   Social History  . Marital status: Married    Spouse name: N/A  . Number of children: 2  . Years of education: N/A   Occupational History  . works for the city of [place]  . Smoking status: Former Research scientist (life sciences)  . Smokeless tobacco: Never Used  . Alcohol use 0.0 oz/week     Comment: socially   . Drug use: No  . Sexual activity: Not on file   Other Topics Concern  . Not on file   Social History Narrative   Married, separated , moved to her own place ~ 11-2014   2 daughters 2001, 2005    original from [place]            Medication List       Accurate as of 06/11/16 11:59 PM. Always use your most recent med list.          ALPRAZolam 0.5 MG tablet Commonly known as:  XANAX Take 1 tablet (0.5 mg total) by mouth at bedtime as needed for sleep.   FLUoxetine 20 MG tablet Commonly known as:  PROZAC Take 1 tablet (20 mg total) by mouth daily.   meclizine 25 MG tablet Commonly known as:  ANTIVERT Take 1 tablet (25 mg total) by mouth 3  (three) times daily as needed for dizziness.   metoprolol tartrate 25 MG tablet Commonly known as:  LOPRESSOR Take 1 tablet (25 mg total) by mouth 2 (two) times daily.   VITAMIN B 12 PO Take 3,000 mg by mouth every morning.          Objective:   Physical Exam BP 102/62 (BP Location: Right Arm, Patient Position: Sitting, Cuff Size: Normal)   Pulse (!) 58   Temp 98 F (36.7 C) (Oral)   Resp 16   Ht 5\' 3"  (1.6 m)   Wt 177 lb (80.3 kg)   LMP 06/01/2016 (Exact Date)   SpO2 98%   BMI 31.35 kg/m me know  General:   Well developed, well nourished . NAD.  HEENT:  Normocephalic . Face symmetric, atraumatic   Skin: Not pale. Not jaundice Neurologic:  alert & oriented X3.  Speech normal, gait appropriate for age and unassisted Psych--  Cognition and judgment appear intact.  Cooperative with normal attention span and concentration.  Behavior appropriate. No anxious or depressed appearing. Seems to be doing well, smiling, in no distress.  Assessment & Plan:   Assessment Anxiety, depression, insomnia Vitamin D deficiency Atypical mole, chest 02-2016 Birth control -- IUD  PLAN: Anxiety depression and insomnia: States she feels okay, but will not say she feels better; self decreased fluoxetine to 20 mg (see HPI).  I believe she has improved thus  recommend to stay on fluoxetine 20 mg. Fatigue: Ill-defined fatigue today, Epworth scale 11 which is high. Refer for a sleep study B12 deficiency: Continue supplements, last labs show improvement on the B12. Elevated BP: On metoprolol since the last visit, ambulatory BPs 120/80, today's a little low. For now recommend to continue metoprolol. OK to cutdown the dose if BP too low RTC 3 months

## 2016-06-12 NOTE — Assessment & Plan Note (Signed)
Anxiety depression and insomnia: States she feels okay, but will not say she feels better; self decreased fluoxetine to 20 mg (see HPI).  I believe she has improved thus  recommend to stay on fluoxetine 20 mg. Fatigue: Ill-defined fatigue today, Epworth scale 11 which is high. Refer for a sleep study B12 deficiency: Continue supplements, last labs show improvement on the B12. Elevated BP: On metoprolol since the last visit, ambulatory BPs 120/80, today's a little low. For now recommend to continue metoprolol. OK to cutdown the dose if BP too low RTC 3 months

## 2016-06-17 ENCOUNTER — Other Ambulatory Visit: Payer: BLUE CROSS/BLUE SHIELD

## 2016-07-02 ENCOUNTER — Institutional Professional Consult (permissible substitution): Payer: BLUE CROSS/BLUE SHIELD | Admitting: Neurology

## 2016-07-07 ENCOUNTER — Telehealth: Payer: Self-pay

## 2016-07-07 MED ORDER — ALPRAZOLAM 0.5 MG PO TABS: 0.5000 mg | ORAL_TABLET | Freq: Every evening | ORAL | 2 refills | Status: DC | PRN

## 2016-07-07 NOTE — Telephone Encounter (Signed)
Rx faxed to [facility] pharmacy.  

## 2016-07-07 NOTE — Telephone Encounter (Signed)
Pt is requesting refill on Alprazolam.  Last OV: 06/11/2016 Last Fill: 04/14/2016 #30 and 1RF UDS: 02/27/2016 Low risk  Please advise.

## 2016-07-07 NOTE — Telephone Encounter (Signed)
Ok 30 and 2 RF 

## 2016-07-07 NOTE — Telephone Encounter (Signed)
Rx printed, awaiting MD signature.  

## 2016-07-16 LAB — HM PAP SMEAR

## 2016-08-18 ENCOUNTER — Institutional Professional Consult (permissible substitution): Payer: BLUE CROSS/BLUE SHIELD | Admitting: Neurology

## 2016-08-26 ENCOUNTER — Encounter: Payer: Self-pay | Admitting: Podiatry

## 2016-08-26 ENCOUNTER — Ambulatory Visit (INDEPENDENT_AMBULATORY_CARE_PROVIDER_SITE_OTHER): Payer: Commercial Managed Care - HMO | Admitting: Podiatry

## 2016-08-26 VITALS — BP 109/72 | HR 61 | Resp 16 | Ht 64.0 in | Wt 175.0 lb

## 2016-08-26 DIAGNOSIS — L603 Nail dystrophy: Secondary | ICD-10-CM

## 2016-08-26 NOTE — Progress Notes (Signed)
   Subjective:    Patient ID: Cassidy Shepard, female    DOB: [birth year], 45 y.o.   MRN: [id number]  HPI: She presents today with a chief complaint of a painful hallux nail right. States the nail is discolored and thickened she saw her dermatologist who recommended that the nail be removed so that it could grow back more normally. She's tried over-the-counter medication for fungus with no good results. She denies trauma to the nail other than when she was a younger. She is from the country of [place]  All other systems reviewed and are negative.      Objective:   Physical Exam: I have reviewed her past history medications allergies surgeries and social history. Pulses are strongly palpable. Vital signs are stable alert and oriented 3. Neurologic sensorium is intact percent lasting Monocryl. Deep tendon reflexes are intact bilateral. Orthopedic evaluation and states all joints distal to the ankle level range of motion without crepitation. Cutaneous evaluation of a straight supple well-hydrated cutis she does have a thick mycotic nail hallux right which does also demonstrate some nail dystrophy. There is subungual debris with distal onycholysis.        Assessment & Plan:  Assessment: At this point nail dystrophy with possible onychomycosis is the diagnosis.  Plan: I took a sample of the nail today to be sent for pathologic evaluation. I do not feel that is necessary to remove the entire nail at this point. Once the pathology report is returned we will notify her and have her in 4 discussion regarding medication.

## 2016-09-15 ENCOUNTER — Encounter: Payer: Self-pay | Admitting: Internal Medicine

## 2016-09-15 ENCOUNTER — Ambulatory Visit (INDEPENDENT_AMBULATORY_CARE_PROVIDER_SITE_OTHER): Payer: Commercial Managed Care - HMO | Admitting: Internal Medicine

## 2016-09-15 VITALS — BP 98/65 | HR 60 | Temp 98.2°F | Ht 64.0 in | Wt 182.4 lb

## 2016-09-15 DIAGNOSIS — R030 Elevated blood-pressure reading, without diagnosis of hypertension: Secondary | ICD-10-CM | POA: Diagnosis not present

## 2016-09-15 DIAGNOSIS — F419 Anxiety disorder, unspecified: Principal | ICD-10-CM

## 2016-09-15 DIAGNOSIS — F32A Depression, unspecified: Secondary | ICD-10-CM

## 2016-09-15 DIAGNOSIS — F418 Other specified anxiety disorders: Secondary | ICD-10-CM

## 2016-09-15 DIAGNOSIS — F329 Major depressive disorder, single episode, unspecified: Secondary | ICD-10-CM

## 2016-09-15 MED ORDER — FLUOXETINE HCL 20 MG PO TABS: 20.0000 mg | ORAL_TABLET | Freq: Every day | ORAL | 3 refills | Status: DC

## 2016-09-15 NOTE — Progress Notes (Signed)
Pre visit review using our clinic review tool, if applicable. No additional management support is needed unless otherwise documented below in the visit note. 

## 2016-09-15 NOTE — Patient Instructions (Signed)
GO TO THE FRONT DESK Schedule your next appointment for a  Physical by 5 -2018 , fasting  Please see a counselor

## 2016-09-15 NOTE — Progress Notes (Signed)
Subjective:    Patient ID: Cassidy Shepard, female    DOB: Jan 23, 1971, 45 y.o.   MRN: [id number]  DOS:  09/15/2016 Type of visit - description : Routine checkup Interval history:  Anxiety depression: Feeling better, a reminding symptom is irritability from time to time, sx not severe. Still requiring Xanax which works well for sleeping. Elevated BP: On metoprolol, BP today is slightly low. At this point denies fatigue, she has dizziness but is not orthostatic, sometimes gets dizzy when she turns her head in bed. Also reports several months history of losing hair.   Review of Systems   Past Medical History:  Diagnosis Date  . Anxiety and depression, insomnia 10/23/2009  . Unspecified vitamin D deficiency 05/22/2014    Past Surgical History:  Procedure Laterality Date  . APPENDECTOMY    . CESAREAN SECTION     x 2  . CHOLECYSTECTOMY    . INTRAUTERINE DEVICE INSERTION  july 2016  . SKIN SURGERY  02/20/16    Social History   Social History  . Marital status: Married    Spouse name: N/A  . Number of children: 2  . Years of education: N/A   Occupational History  . works for the city of [place]  . Smoking status: Former Research scientist (life sciences)  . Smokeless tobacco: Never Used  . Alcohol use 0.0 oz/week     Comment: socially   . Drug use: No  . Sexual activity: Not on file   Other Topics Concern  . Not on file   Social History Narrative   Married, separated , moved to her own place ~ 11-2014   2 daughters 2001, 2005    original from [place]            Medication List       Accurate as of 09/15/16 11:59 PM. Always use your most recent med list.          ALPRAZolam 0.5 MG tablet Commonly known as:  XANAX Take 1 tablet (0.5 mg total) by mouth at bedtime as needed for sleep.   FLUoxetine 20 MG tablet Commonly known as:  PROZAC Take 1 tablet (20 mg total) by mouth daily.   meclizine 25 MG tablet Commonly known as:   ANTIVERT Take 1 tablet (25 mg total) by mouth 3 (three) times daily as needed for dizziness.   metoprolol tartrate 25 MG tablet Commonly known as:  LOPRESSOR Take 12.5 mg by mouth 2 (two) times daily.   VITAMIN B 12 PO Take 3,000 mg by mouth every morning.          Objective:   Physical Exam BP 98/65 (BP Location: Right Arm, Patient Position: Sitting, Cuff Size: Normal)   Pulse 60   Temp 98.2 F (36.8 C) (Oral)   Ht 5\' 4"  (1.626 m)   Wt 182 lb 6.4 oz (82.7 kg)   LMP 08/22/2016 (Approximate)   SpO2 100% Comment: RA  BMI 31.31 kg/m  General:   Well developed, well nourished . NAD.  HEENT:  Normocephalic . Face symmetric, atraumatic Lungs:  CTA B Normal respiratory effort, no intercostal retractions, no accessory muscle use. Heart: RRR,  no murmur.  No pretibial edema bilaterally  Skin: Scalp is healthy, anteriorly her hair is less dense than posteriorly but otherwise looks normal. Neurologic:  alert & oriented X3.  Speech normal, gait appropriate for age and unassisted Psych--  Cognition and judgment appear intact.  Cooperative with normal  attention span and concentration.  Behavior appropriate. No anxious or depressed appearing.      Assessment & Plan:    Assessment Anxiety, depression, insomnia Vitamin D deficiency Atypical mole, chest 02-2016 Birth control -- IUD  PLAN: Anxiety depression and insomnia: Better controlled, she is slightly irritable sometimes. For now recommend to continue meds and see a counselor. Losing hair: Scalp looks healthy, not patchy hair loss. Last TSH normal. We discussed possibly RPR or refer to dermatology. Will think about it. Elevated BP: on Metoprolol. BP today is in the low side, decrease metoprolol to 12.5 mg twice a day. RTC 02-2017 CPX

## 2016-09-17 DIAGNOSIS — I10 Essential (primary) hypertension: Secondary | ICD-10-CM | POA: Insufficient documentation

## 2016-09-17 NOTE — Assessment & Plan Note (Signed)
Anxiety depression and insomnia: Better controlled, she is slightly irritable sometimes. For now recommend to continue meds and see a counselor. Losing hair: Scalp looks healthy, not patchy hair loss. Last TSH normal. We discussed possibly RPR or refer to dermatology. Will think about it. Elevated BP: on Metoprolol. BP today is in the low side, decrease metoprolol to 12.5 mg twice a day. RTC 02-2017 CPX

## 2016-09-23 ENCOUNTER — Encounter: Payer: Self-pay | Admitting: Podiatry

## 2016-09-23 ENCOUNTER — Ambulatory Visit (INDEPENDENT_AMBULATORY_CARE_PROVIDER_SITE_OTHER): Payer: BLUE CROSS/BLUE SHIELD | Admitting: Podiatry

## 2016-09-23 DIAGNOSIS — Z79899 Other long term (current) drug therapy: Secondary | ICD-10-CM

## 2016-09-23 DIAGNOSIS — L603 Nail dystrophy: Secondary | ICD-10-CM

## 2016-09-23 MED ORDER — TERBINAFINE HCL 250 MG PO TABS: 250.0000 mg | ORAL_TABLET | Freq: Every day | ORAL | 0 refills | Status: DC

## 2016-09-23 NOTE — Patient Instructions (Signed)

## 2016-09-24 NOTE — Progress Notes (Signed)
She presents today for follow-up of her fungal culture.  Objective: Pathology report does demonstrate onychomycosis.  Assessment: Onychomycosis.  Plan: After thorough discussion of her options both oral and topical and laser therapies at this point she is requesting oral therapy with the use of Lamisil tablets 250 mg tablets. We dispensed 30 of these after liver profile. Should this liver profile back abnormal I will notify her immediately. We discussed the pros and cons and the use of this medication and the risks involved she understands this and is amenable to it. I will follow-up with her in 1 month for another set of blood work as another prescription for 90 tablets. Should she have questions or concerns she will notify us immediately.

## 2016-10-02 ENCOUNTER — Other Ambulatory Visit: Payer: Self-pay | Admitting: Internal Medicine

## 2016-10-24 ENCOUNTER — Encounter: Payer: Self-pay | Admitting: Medical

## 2016-10-24 ENCOUNTER — Ambulatory Visit (INDEPENDENT_AMBULATORY_CARE_PROVIDER_SITE_OTHER): Payer: Commercial Managed Care - HMO | Admitting: Medical

## 2016-10-24 VITALS — BP 121/73 | HR 57 | Temp 97.9°F | Resp 16 | Ht 64.0 in | Wt 186.5 lb

## 2016-10-24 DIAGNOSIS — N3001 Acute cystitis with hematuria: Secondary | ICD-10-CM | POA: Diagnosis not present

## 2016-10-24 DIAGNOSIS — R399 Unspecified symptoms and signs involving the genitourinary system: Secondary | ICD-10-CM | POA: Diagnosis not present

## 2016-10-24 LAB — POC URINALSYSI DIPSTICK (AUTOMATED)
Bilirubin, UA: NEGATIVE
Blood, UA: POSITIVE
GLUCOSE UA: NEGATIVE
KETONES UA: NEGATIVE
Nitrite, UA: NEGATIVE
PROTEIN UA: NEGATIVE
SPEC GRAV UA: 1.02
Urobilinogen, UA: 0.2
pH, UA: 6

## 2016-10-24 MED ORDER — NITROFURANTOIN MONOHYD MACRO 100 MG PO CAPS: 100.0000 mg | ORAL_CAPSULE | Freq: Two times a day (BID) | ORAL | 0 refills | Status: DC

## 2016-10-24 MED ORDER — PHENAZOPYRIDINE HCL 200 MG PO TABS: 200.0000 mg | ORAL_TABLET | Freq: Three times a day (TID) | ORAL | 0 refills | Status: DC | PRN

## 2016-10-24 NOTE — Patient Instructions (Addendum)
You appear to have a urinary tract infection. I am prescribing macrobid antibiotic for the probable infection. Hydrate well. I am sending out a urine culture. During the interim if your signs and symptoms worsen rather than improving please notify us. We will notify your when the culture results are back.  For urinary pain will rx pyridium  Follow up in 7 days or as needed.

## 2016-10-24 NOTE — Progress Notes (Signed)
Subjective:    Patient ID: Cassidy Shepard, female    DOB: [birth year], 46 y.o.   MRN: [id number]  HPI  Pt in with pain on urination for 3 days. Pain moderate. Worse yesterday.   Pt in today reporting urinary symptoms.  Dysuria- yes Frequent urination-some but not a lot. Hesitancy-no Suprapubic pressure-no Fever-no chills-no Nausea-wed. Vomiting-no CVA pain-mild rt side pain today in am. History of UTI-occasional 3-4 times in her life. Gross hematuria-no blood in urine.  LMP- Oct 12, 2016.      Review of Systems  Constitutional: Negative for chills, fatigue and fever.  Respiratory: Negative for cough, chest tightness, shortness of breath and wheezing.   Cardiovascular: Negative for chest pain and palpitations.  Gastrointestinal: Negative for abdominal pain.  Genitourinary: Positive for dysuria, frequency and urgency. Negative for decreased urine volume, difficulty urinating, pelvic pain, vaginal bleeding and vaginal pain.  Musculoskeletal: Negative for back pain and myalgias.  Skin: Negative for rash.  Neurological: Negative for dizziness, seizures, weakness, light-headedness and numbness.  Hematological: Negative for adenopathy. Does not bruise/bleed easily.  Psychiatric/Behavioral: Negative for behavioral problems and confusion.    Past Medical History:  Diagnosis Date  . Anxiety and depression, insomnia 10/23/2009  . Unspecified vitamin D deficiency 05/22/2014     Social History   Social History  . Marital status: Married    Spouse name: N/A  . Number of children: 2  . Years of education: N/A   Occupational History  . works for the city of [place]  . Smoking status: Former Research scientist (life sciences)  . Smokeless tobacco: Never Used  . Alcohol use 0.0 oz/week     Comment: socially   . Drug use: No  . Sexual activity: Not on file   Other Topics Concern  . Not on file   Social History Narrative   Married, separated , moved to  her own place ~ 11-2014   2 daughters 2001, 2005    original from [place]        Past Surgical History:  Procedure Laterality Date  . APPENDECTOMY    . CESAREAN SECTION     x 2  . CHOLECYSTECTOMY    . INTRAUTERINE DEVICE INSERTION  july 2016  . SKIN SURGERY  02/20/16    Family History  Problem Relation Age of Onset  . Diabetes Other     GF  . Colon cancer Neg Hx   . Breast cancer Neg Hx   . CAD Neg Hx   . Skin cancer Neg Hx     Allergies  Allergen Reactions  . Sulfonamide Derivatives     REACTION: rash    Current Outpatient Prescriptions on File Prior to Visit  Medication Sig Dispense Refill  . ALPRAZolam (XANAX) 0.5 MG tablet Take 1 tablet (0.5 mg total) by mouth at bedtime as needed for sleep. 30 tablet 2  . Cyanocobalamin (VITAMIN B 12 PO) Take 3,000 mg by mouth every morning.    Marland Kitchen FLUoxetine (PROZAC) 20 MG tablet Take 1 tablet (20 mg total) by mouth daily. 60 tablet 3  . meclizine (ANTIVERT) 25 MG tablet Take 1 tablet (25 mg total) by mouth 3 (three) times daily as needed for dizziness. 30 tablet 0  . metoprolol tartrate (LOPRESSOR) 25 MG tablet Take 0.5 tablets (12.5 mg total) by mouth 2 (two) times daily. 30 tablet 5  . terbinafine (LAMISIL) 250 MG tablet Take 1 tablet (250 mg total) by mouth daily. Alsip  tablet 0   No current facility-administered medications on file prior to visit.     BP 121/73 (BP Location: Right Arm, Patient Position: Sitting, Cuff Size: Large)   Pulse (!) 57   Temp 97.9 F (36.6 C) (Oral)   Resp 16   Ht 5\' 4"  (1.626 m)   Wt 186 lb 8 oz (84.6 kg)   LMP 10/12/2016   SpO2 100%   BMI 32.01 kg/m       Objective:   Physical Exam  General Appearance- Not in acute distress.  HEENT Eyes- Scleraeral/Conjuntiva-bilat- Not Yellow. Mouth & Throat- Normal.  Chest and Lung Exam Auscultation: Breath sounds:-Normal. Adventitious sounds:- No Adventitious sounds.  Cardiovascular Auscultation:Rythm - Regular. Heart Sounds -Normal heart  sounds.  Abdomen Inspection:-Inspection Normal.  Palpation/Perucssion: Palpation and Percussion of the abdomen reveal- mild suprapubic  Tenderness, No Rebound tenderness, No rigidity(Guarding) and No Palpable abdominal masses.  Liver:-Normal.  Spleen:- Normal.   Back- no cva tenderness.      Assessment & Plan:  You appear to have a urinary tract infection. I am prescribing macrobid antibiotic for the probable infection. Hydrate well. I am sending out a urine culture. During the interim if your signs and symptoms worsen rather than improving please notify us. We will notify your when the culture results are back.  For urinary pain will rx pyridium  Recent use amoxicillin for 3-4 days. But possible resistance?  Follow up in 7 days or as needed.  Pt had a question after discussing uti. She wanted to know about b-blocker and hair loss. Dermatologist thought her hair loss might be b-blocker related. I don't think by inspection hair loss significant and bp is very well controlled. So I asked her to pass Dr. Larose Kells my chart message or call about possible change.

## 2016-10-24 NOTE — Progress Notes (Signed)
Pre visit review using our clinic review tool, if applicable. No additional management support is needed unless otherwise documented below in the visit note/SLS  

## 2016-10-24 NOTE — Addendum Note (Signed)
Addended by: Rockwell Germany on: 10/24/2016 05:13 PM   Modules accepted: Orders

## 2016-10-26 LAB — CULTURE, URINE COMPREHENSIVE: Organism ID, Bacteria: NO GROWTH

## 2016-10-28 ENCOUNTER — Ambulatory Visit: Payer: BLUE CROSS/BLUE SHIELD | Admitting: Podiatry

## 2016-10-30 ENCOUNTER — Other Ambulatory Visit: Payer: Self-pay

## 2016-10-30 DIAGNOSIS — R300 Dysuria: Secondary | ICD-10-CM

## 2016-11-03 ENCOUNTER — Other Ambulatory Visit (HOSPITAL_COMMUNITY)
Admission: RE | Admit: 2016-11-03 | Discharge: 2016-11-03 | Disposition: A | Discharge status: Home / Self Care | Payer: Commercial Managed Care - HMO | Source: Ambulatory Visit | Attending: Medical | Admitting: Medical

## 2016-11-03 ENCOUNTER — Other Ambulatory Visit (INDEPENDENT_AMBULATORY_CARE_PROVIDER_SITE_OTHER): Payer: Commercial Managed Care - HMO

## 2016-11-03 DIAGNOSIS — R300 Dysuria: Secondary | ICD-10-CM | POA: Diagnosis not present

## 2016-11-03 LAB — POC URINALSYSI DIPSTICK (AUTOMATED)
BILIRUBIN UA: NEGATIVE
Glucose, UA: NEGATIVE
KETONES UA: NEGATIVE
Leukocytes, UA: NEGATIVE
NITRITE UA: NEGATIVE
PH UA: 6
Protein, UA: NEGATIVE
RBC UA: NEGATIVE
SPEC GRAV UA: 1.01
Urobilinogen, UA: 0.2

## 2016-11-04 ENCOUNTER — Telehealth: Payer: Self-pay

## 2016-11-04 MED ORDER — ALPRAZOLAM 0.5 MG PO TABS: 0.5000 mg | ORAL_TABLET | Freq: Every evening | ORAL | 2 refills | Status: DC | PRN

## 2016-11-04 NOTE — Telephone Encounter (Signed)
Rx printed, awaiting MD signature.  

## 2016-11-04 NOTE — Telephone Encounter (Signed)
Okay #30, 2 refills 

## 2016-11-04 NOTE — Telephone Encounter (Signed)
Pt is requesting refill on Alprazolam.  Last OV: 09/15/2016 Last Fill: 07/07/2016 #30 and 2RF UDS: 02/27/2016 Low risk  Please advise

## 2016-11-04 NOTE — Telephone Encounter (Signed)
Rx faxed to [facility] pharmacy.  

## 2016-11-06 LAB — URINE CYTOLOGY ANCILLARY ONLY
Chlamydia: NEGATIVE
Neisseria Gonorrhea: NEGATIVE
TRICH (WINDOWPATH): NEGATIVE

## 2016-11-10 LAB — URINE CYTOLOGY ANCILLARY ONLY
Bacterial vaginitis: NEGATIVE
Candida vaginitis: NEGATIVE

## 2016-12-11 ENCOUNTER — Other Ambulatory Visit: Payer: Self-pay | Admitting: Podiatry

## 2017-01-12 ENCOUNTER — Telehealth: Payer: Self-pay | Admitting: *Deleted

## 2017-01-12 NOTE — Telephone Encounter (Signed)
Pt called concerning the prescription that was denied. I reviewed pt's LOV 09/2016 and she had been instructed to return to the office after 30 doses of the Lamisil for labs. I informed pt and she states she forgot and I transferred her to schedulers for an appt.

## 2017-01-16 ENCOUNTER — Encounter: Payer: Self-pay | Admitting: Podiatry

## 2017-01-16 ENCOUNTER — Ambulatory Visit (INDEPENDENT_AMBULATORY_CARE_PROVIDER_SITE_OTHER): Payer: BLUE CROSS/BLUE SHIELD | Admitting: Podiatry

## 2017-01-16 DIAGNOSIS — Z79899 Other long term (current) drug therapy: Secondary | ICD-10-CM | POA: Diagnosis not present

## 2017-01-16 DIAGNOSIS — L603 Nail dystrophy: Secondary | ICD-10-CM

## 2017-01-16 LAB — HEPATIC FUNCTION PANEL
ALT: 9 U/L (ref 6–29)
AST: 10 U/L (ref 10–35)
Albumin: 3.7 g/dL (ref 3.6–5.1)
Alkaline Phosphatase: 43 U/L (ref 33–115)
BILIRUBIN DIRECT: 0.1 mg/dL (ref <=0.2)
BILIRUBIN TOTAL: 0.4 mg/dL (ref 0.2–1.2)
Indirect Bilirubin: 0.3 mg/dL (ref 0.2–1.2)
Total Protein: 7.1 g/dL (ref 6.1–8.1)

## 2017-01-16 MED ORDER — TERBINAFINE HCL 250 MG PO TABS: 250.0000 mg | ORAL_TABLET | Freq: Every day | ORAL | 0 refills | Status: DC

## 2017-01-16 NOTE — Progress Notes (Signed)
She presents today for follow-up of her Lamisil therapy she has not been taking it for the past couple of months because of a urinary tract infection she's been fighting. She states that she denied any problems with the medication.  Objective: Vital signs are stable she's alert 3 objective does not demonstrate any type of change in her nail plates as of yet.  Assessment: Onychomycosis.  Plan: Started her on 90 days of Lamisil and requested another liver profile. I will follow-up with her should this come back abnormal. Otherwise I will see her in 4 months.

## 2017-01-21 ENCOUNTER — Telehealth: Payer: Self-pay | Admitting: *Deleted

## 2017-01-21 NOTE — Telephone Encounter (Addendum)
-----   Message from Garrel Ridgel, [place] sent at 01/21/2017 11:56 AM EDT ----- Labs WNL. Pt may continue lamisil. Left message informing pt Dr. Milinda Pointer states the labs are normal and may continue the medication.

## 2017-03-04 ENCOUNTER — Encounter: Payer: BLUE CROSS/BLUE SHIELD | Admitting: Internal Medicine

## 2017-03-04 ENCOUNTER — Ambulatory Visit (INDEPENDENT_AMBULATORY_CARE_PROVIDER_SITE_OTHER): Payer: Commercial Managed Care - HMO | Admitting: Internal Medicine

## 2017-03-04 ENCOUNTER — Encounter: Payer: Self-pay | Admitting: Internal Medicine

## 2017-03-04 VITALS — BP 112/65 | HR 71 | Temp 98.5°F | Ht 64.0 in | Wt 192.8 lb

## 2017-03-04 DIAGNOSIS — Z0000 Encounter for general adult medical examination without abnormal findings: Secondary | ICD-10-CM

## 2017-03-04 NOTE — Patient Instructions (Signed)
GO TO THE LAB : Get the blood work  . Also provide a urine sample for a UDS   GO TO THE FRONT DESK Schedule your next appointment for a  physical exam in one year   For elbow pain:  Use a tennis elbow brace daily for 4-5 weeks.  IBUPROFEN (Advil or Motrin) 200 mg 2 tablets every 8 hours as needed for pain.  Always take it with food because may cause gastritis and ulcers.  If you notice nausea, stomach pain, change in the color of stools --->  Stop the medicine and let us know  If not better, call for a referral

## 2017-03-04 NOTE — Progress Notes (Signed)
Subjective:    Patient ID: Cassidy Shepard, female    DOB: Aug 07, 1971, 46 y.o.   MRN: [id number]  DOS:  03/04/2017 Type of visit - description : CPX Interval history: In general feeling well   Review of Systems Anxiety, depression, insomnia--- well controlled, she was a little irritable few weeks ago, now is doing psychotherapy and feeling better. Few weeks ago developed pain at the right arm, just distal from the elbow, worse with certain head movements. No injury. Has not taken any medication.   Other than above, a 14 point review of systems is negative     Past Medical History:  Diagnosis Date  . Anxiety and depression, insomnia 10/23/2009  . Unspecified vitamin D deficiency 05/22/2014    Past Surgical History:  Procedure Laterality Date  . APPENDECTOMY    . CESAREAN SECTION     x 2  . CHOLECYSTECTOMY    . INTRAUTERINE DEVICE INSERTION  july 2016  . SKIN SURGERY  02/20/16    Social History   Social History  . Marital status: Married    Spouse name: N/A  . Number of children: 2  . Years of education: N/A   Occupational History  . works for the city of [place]  . Smoking status: Former Research scientist (life sciences)  . Smokeless tobacco: Never Used  . Alcohol use 0.0 oz/week     Comment: socially   . Drug use: No  . Sexual activity: Not on file   Other Topics Concern  . Not on file   Social History Narrative   Married, separated , moved to her own place ~ 11-2014   2 daughters 2001, 2005, shared custody   Original from [place]         Family History  Problem Relation Age of Onset  . Diabetes Other        GF  . Colon cancer Neg Hx   . Breast cancer Neg Hx   . CAD Neg Hx   . Skin cancer Neg Hx     Allergies as of 03/04/2017      Reactions   Sulfonamide Derivatives    REACTION: rash      Medication List       Accurate as of 03/04/17 11:59 PM. Always use your most recent med list.          ALPRAZolam 0.5 MG  tablet Commonly known as:  XANAX Take 1 tablet (0.5 mg total) by mouth at bedtime as needed for sleep.   FLUoxetine 20 MG tablet Commonly known as:  PROZAC Take 1 tablet (20 mg total) by mouth daily.   metoprolol tartrate 25 MG tablet Commonly known as:  LOPRESSOR Take 0.5 tablets (12.5 mg total) by mouth 2 (two) times daily.   PARAGARD INTRAUTERINE COPPER Iud IUD 1 each by Intrauterine route once.   terbinafine 250 MG tablet Commonly known as:  LAMISIL Take 1 tablet (250 mg total) by mouth daily.          Objective:   Physical Exam BP 112/65 (BP Location: Left Arm, Patient Position: Sitting, Cuff Size: Normal)   Pulse 71   Temp 98.5 F (36.9 C) (Oral)   Ht 5\' 4"  (1.626 m)   Wt 192 lb 12.8 oz (87.5 kg)   SpO2 100% Comment: RA  BMI 33.09 kg/m   General:   Well developed, well nourished . NAD.  Neck: No  thyromegaly  HEENT:  Normocephalic . Face  symmetric, atraumatic Lungs:  CTA B Normal respiratory effort, no intercostal retractions, no accessory muscle use. Heart: RRR,  no murmur.  No pretibial edema bilaterally  Abdomen:  Not distended, soft, non-tender. No rebound or rigidity.   Skin: Exposed areas without rash. Not pale. Not jaundice MSK: Arms symmetric, normal to inspection on palpation except for mild tenderness just distal from the right elbow. Neurologic:  alert & oriented X3.  Speech normal, gait appropriate for age and unassisted Strength symmetric and appropriate for age.  Psych: Cognition and judgment appear intact.  Cooperative with normal attention span and concentration.  Behavior appropriate. No anxious or depressed appearing.    Assessment & Plan:   Assessment Anxiety, depression, insomnia Vitamin D deficiency Atypical mole, chest 02-2016 Birth control -- IUD  PLAN:  Anxiety depression insomnia: Well-controlled currently on fluoxetine and nightly Xanax. She also sees a psychotherapist which is great. Will check a UDS, sign a contract,  call for refills as needed  Tennis elbow: Right arm pain likely due to tennis elbow, recommend a brace and ibuprofen as needed. GI precautions discussed, call if not better RTC one year

## 2017-03-04 NOTE — Progress Notes (Signed)
Pre visit review using our clinic review tool, if applicable. No additional management support is needed unless otherwise documented below in the visit note. 

## 2017-03-04 NOTE — Assessment & Plan Note (Addendum)
--  Td 09-2013 per pt @ gyn office  --Sees gyn for female care   --Life style discussed. Plans to do "keto diet" and would like to check her cholesterol today and in few months. That is okay, she will let me know when ready for another FLP --Labs :  BMP, FLP, A1c, TSH, HIV

## 2017-03-05 ENCOUNTER — Encounter: Payer: Self-pay | Admitting: Internal Medicine

## 2017-03-05 DIAGNOSIS — Z79899 Other long term (current) drug therapy: Secondary | ICD-10-CM | POA: Diagnosis not present

## 2017-03-05 LAB — HEMOGLOBIN A1C: HEMOGLOBIN A1C: 5.7 % (ref 4.6–6.5)

## 2017-03-05 LAB — BASIC METABOLIC PANEL
BUN: 11 mg/dL (ref 6–23)
CALCIUM: 9.3 mg/dL (ref 8.4–10.5)
CO2: 25 meq/L (ref 19–32)
CREATININE: 0.66 mg/dL (ref 0.40–1.20)
Chloride: 103 mEq/L (ref 96–112)
GFR: 102.77 mL/min (ref >60.00)
GLUCOSE: 76 mg/dL (ref 70–99)
Potassium: 4.4 mEq/L (ref 3.5–5.1)
Sodium: 135 mEq/L (ref 135–145)

## 2017-03-05 LAB — HIV ANTIBODY (ROUTINE TESTING W REFLEX): HIV: NONREACTIVE

## 2017-03-05 LAB — LIPID PANEL
CHOL/HDL RATIO: 4
Cholesterol: 192 mg/dL (ref 0–200)
HDL: 51.5 mg/dL (ref >39.00)
LDL CALC: 125 mg/dL — AB (ref 0–99)
NONHDL: 140.16
Triglycerides: 76 mg/dL (ref 0.0–149.0)
VLDL: 15.2 mg/dL (ref 0.0–40.0)

## 2017-03-05 LAB — TSH: TSH: 1.07 u[IU]/mL (ref 0.35–4.50)

## 2017-03-05 NOTE — Assessment & Plan Note (Signed)
Anxiety depression insomnia: Well-controlled currently on fluoxetine and nightly Xanax. She also sees a psychotherapist which is great. Will check a UDS, sign a contract, call for refills as needed  Tennis elbow: Right arm pain likely due to tennis elbow, recommend a brace and ibuprofen as needed. GI precautions discussed, call if not better RTC one year

## 2017-03-11 ENCOUNTER — Telehealth: Payer: Self-pay

## 2017-03-11 NOTE — Telephone Encounter (Signed)
UDS: 03/05/2017  Alprazolam not detected Alpha-hydroxyalprozolam (metabolite of alprazolam) detected   Low risk per PCP 03/11/2017

## 2017-03-17 ENCOUNTER — Telehealth: Payer: Self-pay

## 2017-03-17 MED ORDER — ALPRAZOLAM 0.5 MG PO TABS: 0.5000 mg | ORAL_TABLET | Freq: Every evening | ORAL | 2 refills | Status: DC | PRN

## 2017-03-17 NOTE — Telephone Encounter (Signed)
Okay 30 and 2 refills 

## 2017-03-17 NOTE — Telephone Encounter (Signed)
Rx faxed to [facility] pharmacy.  

## 2017-03-17 NOTE — Telephone Encounter (Signed)
Pt is requesting refill on Alprazolam.  Last OV: 03/04/2017 Last Fill: 11/04/2016 #30 and 2RF UDS: 03/05/2017 Low risk  Please advise.

## 2017-03-17 NOTE — Telephone Encounter (Signed)
Rx printed, awaiting MD signature.  

## 2017-06-17 DIAGNOSIS — Z1231 Encounter for screening mammogram for malignant neoplasm of breast: Secondary | ICD-10-CM | POA: Diagnosis not present

## 2017-06-17 LAB — HM MAMMOGRAPHY

## 2017-06-18 ENCOUNTER — Encounter: Payer: Self-pay | Admitting: Internal Medicine

## 2017-06-18 DIAGNOSIS — N6001 Solitary cyst of right breast: Secondary | ICD-10-CM | POA: Diagnosis not present

## 2017-06-18 DIAGNOSIS — R922 Inconclusive mammogram: Secondary | ICD-10-CM | POA: Diagnosis not present

## 2017-06-23 ENCOUNTER — Telehealth: Payer: Self-pay | Admitting: *Deleted

## 2017-06-23 NOTE — Telephone Encounter (Signed)
Received results from [facility] with new recommendations; forwarded to provider/SLS 09/04

## 2017-06-23 NOTE — Telephone Encounter (Signed)
Received Physician Orders from [facility]; forwarded to provider/SLS 09/04

## 2017-06-24 NOTE — Telephone Encounter (Signed)
Orders signed and faxed to [phone]. Orders sent for scanning.

## 2017-06-29 ENCOUNTER — Encounter: Payer: Self-pay | Admitting: Internal Medicine

## 2017-07-28 ENCOUNTER — Telehealth: Payer: Self-pay

## 2017-07-28 MED ORDER — ALPRAZOLAM 0.5 MG PO TABS: 0.5000 mg | ORAL_TABLET | Freq: Every evening | ORAL | 2 refills | Status: DC | PRN

## 2017-07-28 NOTE — Telephone Encounter (Signed)
Ok 30 and 2 RF 

## 2017-07-28 NOTE — Telephone Encounter (Signed)
Rx faxed to [facility] pharmacy.  

## 2017-07-28 NOTE — Telephone Encounter (Signed)
Pt is requesting refill on alprazolam 0.5mg .  Last OV: 03/04/2017 Last Fill: 03/17/2017 #30 and 2RF UDS: 03/05/2017 Low risk  NCCR printed, no issues noted   Please advise.

## 2017-09-07 ENCOUNTER — Other Ambulatory Visit: Payer: Self-pay | Admitting: Internal Medicine

## 2017-09-09 ENCOUNTER — Ambulatory Visit: Payer: 59 | Admitting: Internal Medicine

## 2017-09-09 ENCOUNTER — Encounter: Payer: Self-pay | Admitting: Internal Medicine

## 2017-09-09 ENCOUNTER — Ambulatory Visit: Payer: Commercial Managed Care - HMO | Admitting: Internal Medicine

## 2017-09-09 ENCOUNTER — Ambulatory Visit (HOSPITAL_BASED_OUTPATIENT_CLINIC_OR_DEPARTMENT_OTHER)
Admission: RE | Admit: 2017-09-09 | Discharge: 2017-09-09 | Disposition: A | Discharge status: Home / Self Care | Payer: 59 | Source: Ambulatory Visit | Attending: Internal Medicine | Admitting: Internal Medicine

## 2017-09-09 VITALS — BP 136/78 | HR 78 | Temp 97.8°F | Resp 14 | Ht 64.0 in | Wt 193.5 lb

## 2017-09-09 DIAGNOSIS — F32A Depression, unspecified: Secondary | ICD-10-CM

## 2017-09-09 DIAGNOSIS — R079 Chest pain, unspecified: Secondary | ICD-10-CM | POA: Diagnosis not present

## 2017-09-09 DIAGNOSIS — F329 Major depressive disorder, single episode, unspecified: Secondary | ICD-10-CM | POA: Diagnosis not present

## 2017-09-09 DIAGNOSIS — Z23 Encounter for immunization: Secondary | ICD-10-CM

## 2017-09-09 DIAGNOSIS — F419 Anxiety disorder, unspecified: Secondary | ICD-10-CM | POA: Diagnosis not present

## 2017-09-09 DIAGNOSIS — I10 Essential (primary) hypertension: Secondary | ICD-10-CM | POA: Diagnosis not present

## 2017-09-09 LAB — CBC WITH DIFFERENTIAL/PLATELET
BASOS PCT: 0.6 % (ref 0.0–3.0)
Basophils Absolute: 0.1 10*3/uL (ref 0.0–0.1)
EOS ABS: 0.3 10*3/uL (ref 0.0–0.7)
EOS PCT: 2.9 % (ref 0.0–5.0)
HCT: 37.7 % (ref 36.0–46.0)
Hemoglobin: 12.4 g/dL (ref 12.0–15.0)
LYMPHS ABS: 3 10*3/uL (ref 0.7–4.0)
Lymphocytes Relative: 32.6 % (ref 12.0–46.0)
MCHC: 32.7 g/dL (ref 30.0–36.0)
MCV: 89.5 fl (ref 78.0–100.0)
Monocytes Absolute: 0.8 10*3/uL (ref 0.1–1.0)
Monocytes Relative: 9.1 % (ref 3.0–12.0)
NEUTROS ABS: 5.1 10*3/uL (ref 1.4–7.7)
NEUTROS PCT: 54.8 % (ref 43.0–77.0)
PLATELETS: 342 10*3/uL (ref 150.0–400.0)
RBC: 4.22 Mil/uL (ref 3.87–5.11)
RDW: 13.4 % (ref 11.5–15.5)
WBC: 9.2 10*3/uL (ref 4.0–10.5)

## 2017-09-09 LAB — BASIC METABOLIC PANEL
BUN: 9 mg/dL (ref 6–23)
CO2: 29 mEq/L (ref 19–32)
Calcium: 9.4 mg/dL (ref 8.4–10.5)
Chloride: 104 mEq/L (ref 96–112)
Creatinine, Ser: 0.6 mg/dL (ref 0.40–1.20)
GFR: 114.45 mL/min (ref >60.00)
Glucose, Bld: 107 mg/dL — ABNORMAL HIGH (ref 70–99)
Potassium: 4.6 mEq/L (ref 3.5–5.1)
Sodium: 137 mEq/L (ref 135–145)

## 2017-09-09 MED ORDER — METOPROLOL TARTRATE 25 MG PO TABS: 25.0000 mg | ORAL_TABLET | Freq: Two times a day (BID) | ORAL | 5 refills | Status: DC

## 2017-09-09 NOTE — Progress Notes (Signed)
Pre visit review using our clinic review tool, if applicable. No additional management support is needed unless otherwise documented below in the visit note. 

## 2017-09-09 NOTE — Progress Notes (Signed)
Subjective:    Patient ID: Cassidy Shepard, female    DOB: 1971/01/22, 46 y.o.   MRN: [id number]  DOS:  09/09/2017 Type of visit - description : Acute visit Interval history: Her for  blood pressure mngmt. The patient forgot to take metoprolol for the last 5 weeks, last week went to the gym, was exercising on the elliptical and had pressure-like discomfort at the anterior chest. The next day, she did some running and again developed pressure-like chest discomfort that lasted for a few hours even after she stopped exercising. Few days ago, she took her blood pressure and it was 146/99, she restarted metoprolol but at a higher dose than previously prescribed ( 1 tab bid instead of 1/2 tab bid)   Since then, her BP is better, blood pressure this morning at home 138/90. She is still having mild discomfort in the chest when she does some physical activity, for instance yesterday she walked fast to her car to get a jacket and she develop a pressure like discomfort in the chest that lasted even 30 minutes even after she stop exercising.  No radiation, no nausea, diaphoresis or difficulty breathing.   BP Readings from Last 3 Encounters:  09/09/17 136/78  03/04/17 112/65  10/24/16 121/73   Wt Readings from Last 3 Encounters:  09/09/17 193 lb 8 oz (87.8 kg)  03/04/17 192 lb 12.8 oz (87.5 kg)  10/24/16 186 lb 8 oz (84.6 kg)     Review of Systems Anxiety well controlled, actually only taking Xanax, discontinue fluoxetine 3 months ago.  Some stress at work No lower extremity edema No recent airplane trips or prolonged car trips No palpitations No GERD symptoms, no cough  Past Medical History:  Diagnosis Date  . Anxiety and depression, insomnia 10/23/2009  . Unspecified vitamin D deficiency 05/22/2014    Past Surgical History:  Procedure Laterality Date  . APPENDECTOMY    . CESAREAN SECTION     x 2  . CHOLECYSTECTOMY    . INTRAUTERINE DEVICE INSERTION  july 2016  . SKIN SURGERY  02/20/16     Social History   Socioeconomic History  . Marital status: Married    Spouse name: Not on file  . Number of children: 2  . Years of education: Not on file  . Highest education level: Not on file  Social Needs  . Financial resource strain: Not on file  . Food insecurity - worry: Not on file  . Food insecurity - inability: Not on file  . Transportation needs - medical: Not on file  . Transportation needs - non-medical: Not on file  Occupational History  . Occupation: works for the city of [employer]: city of [employer]  Tobacco Use  . Smoking status: Former Research scientist (life sciences)  . Smokeless tobacco: Never Used  . Tobacco comment: smoke a little as a teenager   Substance and Sexual Activity  . Alcohol use: Yes    Alcohol/week: 0.0 oz    Comment: socially   . Drug use: No  . Sexual activity: Not on file  Other Topics Concern  . Not on file  Social History Narrative   Married, separated , moved to her own place ~ 11-2014   2 daughters 2001, 2005, shared custody   Original from [place]          Allergies as of 09/09/2017      Reactions   Sulfonamide Derivatives    REACTION: rash      Medication  List        Accurate as of 09/09/17 11:59 PM. Always use your most recent med list.          ALPRAZolam 0.5 MG tablet Commonly known as:  XANAX Take 1 tablet (0.5 mg total) by mouth at bedtime as needed for sleep.   metoprolol tartrate 25 MG tablet Commonly known as:  LOPRESSOR Take 1 tablet (25 mg total) by mouth 2 (two) times daily.   PARAGARD INTRAUTERINE COPPER Iud IUD 1 each by Intrauterine route once.          Objective:   Physical Exam BP 136/78 (BP Location: Left Arm, Patient Position: Sitting, Cuff Size: Normal)   Pulse 78   Temp 97.8 F (36.6 C) (Oral)   Resp 14   Ht 5\' 4"  (1.626 m)   Wt 193 lb 8 oz (87.8 kg)   SpO2 97%   BMI 33.21 kg/m  General:   Well developed, well nourished . NAD.  HEENT:  Normocephalic . Face symmetric,  atraumatic Lungs:  CTA B Normal respiratory effort, no intercostal retractions, no accessory muscle use. Chest wall: Slightly TTP at both sides of the sternum Heart: RRR,  no murmur.  no pretibial edema bilaterally  Abdomen:  Not distended, soft, non-tender. No rebound or rigidity.   Skin: Not pale. Not jaundice Neurologic:  alert & oriented X3.  Speech normal, gait appropriate for age and unassisted Psych--  Cognition and judgment appear intact.  Cooperative with normal attention span and concentration.  Behavior appropriate. No anxious or depressed appearing.     Assessment & Plan:  Assessment HTN Anxiety, depression, insomnia Vitamin D deficiency Atypical mole, chest 02-2016 Birth control -- IUD  PLAN:  Chest pain:  Exertional CP w/ no  associated sx, pain somewhat reproducible by palpation.   Patient is 28, female, no FH CAD, + hypertension usually well controlled, former smoker (as a teenager), last LDL 125.  EKG sinus bradycardia, rate 51. Sx related to deconditioning?  She just restarted exercise program.  Recommend to do that gradually. She is very low risk for CAD, will recommend chest x-ray, CBC, BMP.  Watch sxs for now, if severe chest pain, shortness of breath or associated diaphoresis and nausea: Go to the ER HTN: Patient forgot to take beta-blockers for 5 weeks, restarted a week ago at a higher dose, currently 25 mg twice daily.  BP is good today, slightly bradycardic but asx.  No change, recheck in 4 weeks. Anxiety, depression, insomnia: Saw therapist during the summer, felt better, self DC fluoxetine 3 months ago and feels okay.  Currently on Xanax only RTC 4 weeks

## 2017-09-09 NOTE — Patient Instructions (Signed)
GO TO THE LAB : Get the blood work     GO TO THE FRONT DESK Schedule your next appointment for a checkup in 4 weeks  Get your chest x-ray downstairs   Check the  blood pressure 2 or 3 times a week Be sure your blood pressure is between 110/65 and  135/85. Also check your pulse, if it is less than 50 bpm, decrease metoprolol to half tablet twice a day. If it is consistently higher or lower, let me know  He has severe chest pain or the pain is associated with nausea, difficulty breathing, dizziness: Go to the ER

## 2017-09-10 NOTE — Assessment & Plan Note (Signed)
Chest pain:  Exertional CP w/ no  associated sx, pain somewhat reproducible by palpation.   Patient is 80, female, no FH CAD, + hypertension usually well controlled, former smoker (as a teenager), last LDL 125.  EKG sinus bradycardia, rate 51. Sx related to deconditioning?  She just restarted exercise program.  Recommend to do that gradually. She is very low risk for CAD, will recommend chest x-ray, CBC, BMP.  Watch sxs for now, if severe chest pain, shortness of breath or associated diaphoresis and nausea: Go to the ER HTN: Patient forgot to take beta-blockers for 5 weeks, restarted a week ago at a higher dose, currently 25 mg twice daily.  BP is good today, slightly bradycardic but asx.  No change, recheck in 4 weeks. Anxiety, depression, insomnia: Saw therapist during the summer, felt better, self DC fluoxetine 3 months ago and feels okay.  Currently on Xanax only RTC 4 weeks

## 2017-09-15 ENCOUNTER — Encounter: Payer: Self-pay | Admitting: Internal Medicine

## 2017-12-13 ENCOUNTER — Telehealth: Payer: Self-pay | Admitting: Internal Medicine

## 2017-12-14 NOTE — Telephone Encounter (Signed)
Sent!

## 2017-12-14 NOTE — Telephone Encounter (Signed)
Pt is requesting refill on alprazolam   Last OV: 09/09/2017 Last Fill: 07/28/2017 #30 and 2RF UDS: 03/04/2017 Low risk  NCCR printed- no issues noted  Please advise.

## 2018-02-03 ENCOUNTER — Encounter: Payer: Self-pay | Admitting: Internal Medicine

## 2018-02-03 ENCOUNTER — Other Ambulatory Visit: Payer: Self-pay | Admitting: Internal Medicine

## 2018-02-03 MED ORDER — FLUOXETINE HCL 20 MG PO TABS: ORAL_TABLET | ORAL | 1 refills | Status: DC

## 2018-03-08 ENCOUNTER — Ambulatory Visit (INDEPENDENT_AMBULATORY_CARE_PROVIDER_SITE_OTHER): Payer: 59 | Admitting: Internal Medicine

## 2018-03-08 ENCOUNTER — Encounter: Payer: Self-pay | Admitting: Internal Medicine

## 2018-03-08 VITALS — BP 126/74 | HR 68 | Temp 97.8°F | Resp 16 | Ht 64.0 in | Wt 200.0 lb

## 2018-03-08 DIAGNOSIS — Z0000 Encounter for general adult medical examination without abnormal findings: Secondary | ICD-10-CM | POA: Diagnosis not present

## 2018-03-08 DIAGNOSIS — F419 Anxiety disorder, unspecified: Secondary | ICD-10-CM

## 2018-03-08 DIAGNOSIS — Z79899 Other long term (current) drug therapy: Secondary | ICD-10-CM | POA: Diagnosis not present

## 2018-03-08 DIAGNOSIS — F329 Major depressive disorder, single episode, unspecified: Secondary | ICD-10-CM

## 2018-03-08 DIAGNOSIS — F32A Depression, unspecified: Secondary | ICD-10-CM

## 2018-03-08 MED ORDER — FLUOXETINE HCL 20 MG PO TABS: 20.0000 mg | ORAL_TABLET | Freq: Every day | ORAL | 6 refills | Status: DC

## 2018-03-08 MED FILL — FLUoxetine HCL 20 MG CAPS: 20 | 30 days supply | Qty: 30 | Fill #0

## 2018-03-08 NOTE — Assessment & Plan Note (Addendum)
--  Td 09-2013 per pt @ gyn office  --  female care : needs to see a new gyn, Dr Doreatha Lew is out of her insurance network, rec to call ; Next MMG 05/2018 -- CCS: never, no FH --Labs: CMP, FLP, A1c, vit D , B12 --Diet and exercise: Recommend to see the bariatric office

## 2018-03-08 NOTE — Progress Notes (Signed)
Pre visit review using our clinic review tool, if applicable. No additional management support is needed unless otherwise documented below in the visit note. 

## 2018-03-08 NOTE — Patient Instructions (Signed)
GO TO THE LAB : provide a  urine sample   GO TO THE FRONT DESK Schedule labs to be done tomorrow, fasting  Next visit in 6 months  Check the  blood pressure 2  times a month   Be sure your blood pressure is between 110/65 and  135/85. If it is consistently higher or lower, let me know

## 2018-03-08 NOTE — Progress Notes (Signed)
Subjective:    Patient ID: Cassidy Shepard, female    DOB: Mar 27, 1971, 47 y.o.   MRN: [id number]  DOS:  03/08/2018 Type of visit - description : cpx Interval history: Has few concerns, see below  Wt Readings from Last 3 Encounters:  03/08/18 200 lb (90.7 kg)  09/09/17 193 lb 8 oz (87.8 kg)  03/04/17 192 lb 12.8 oz (87.5 kg)     Review of Systems Since the last visit, restarted fluoxetine, feeling better. Having occasional right upper quadrant abdominal pain since January, no pain lately, no associated nausea, vomiting, diarrhea.  Not increased by eating.  History of cholecystectomy. Also few days ago she was reaching back on her car and developed right shoulder pain, mild, does not come together with the RUQ pain.  Has not taken any medication for the sx.  Other than above, a 14 point review of systems is negative     Past Medical History:  Diagnosis Date  . Anxiety and depression, insomnia 10/23/2009  . Unspecified vitamin D deficiency 05/22/2014    Past Surgical History:  Procedure Laterality Date  . APPENDECTOMY    . CESAREAN SECTION     x 2  . CHOLECYSTECTOMY    . INTRAUTERINE DEVICE INSERTION  july 2016  . SKIN SURGERY  02/20/16    Social History   Socioeconomic History  . Marital status: Married    Spouse name: Not on file  . Number of children: 2  . Years of education: Not on file  . Highest education level: Not on file  Occupational History  . Occupation: works for the city of [employer]: city of [employer]  Social Needs  . Financial resource strain: Not on file  . Food insecurity:    Worry: Not on file    Inability: Not on file  . Transportation needs:    Medical: Not on file    Non-medical: Not on file  Tobacco Use  . Smoking status: Former Research scientist (life sciences)  . Smokeless tobacco: Never Used  . Tobacco comment: smoke a little as a teenager   Substance and Sexual Activity  . Alcohol use: Yes    Alcohol/week: 0.0 oz    Comment: socially   . Drug use: No   . Sexual activity: Not on file  Lifestyle  . Physical activity:    Days per week: Not on file    Minutes per session: Not on file  . Stress: Not on file  Relationships  . Social connections:    Talks on phone: Not on file    Gets together: Not on file    Attends religious service: Not on file    Active member of club or organization: Not on file    Attends meetings of clubs or organizations: Not on file    Relationship status: Not on file  . Intimate partner violence:    Fear of current or ex partner: Not on file    Emotionally abused: Not on file    Physically abused: Not on file    Forced sexual activity: Not on file  Other Topics Concern  . Not on file  Social History Narrative   Married, separated , moved to her own place ~ 11-2014   2 daughters 2001, 2005, shared custody   Original from [place]         Family History  Problem Relation Age of Onset  . Diabetes Other        GF  . Colon  cancer Neg Hx   . Breast cancer Neg Hx   . CAD Neg Hx   . Skin cancer Neg Hx      Allergies as of 03/08/2018      Reactions   Sulfonamide Derivatives    REACTION: rash      Medication List        Accurate as of 03/08/18  4:20 PM. Always use your most recent med list.          ALPRAZolam 0.5 MG tablet Commonly known as:  XANAX TAKE 1 TABLET BY MOUTH EVERY NIGHT AT BEDTIME AS NEEDED FOR SLEEP   FLUoxetine 20 MG tablet Commonly known as:  PROZAC Half tablet daily for 1 week, then increase to 1 tablet daily.  Okay to take in the morning or at night.   metoprolol tartrate 25 MG tablet Commonly known as:  LOPRESSOR Take 1 tablet (25 mg total) by mouth 2 (two) times daily.   PARAGARD INTRAUTERINE COPPER Iud IUD 1 each by Intrauterine route once.          Objective:   Physical Exam BP 126/74 (BP Location: Left Arm, Patient Position: Sitting, Cuff Size: Normal)   Pulse 68   Temp 97.8 F (36.6 C) (Oral)   Resp 16   Ht 5\' 4"  (1.626 m)   Wt 200 lb (90.7 kg)    SpO2 98%   BMI 34.33 kg/m  General:   Well developed, obese appearing. NAD.  Neck: No  thyromegaly  HEENT:  Normocephalic . Face symmetric, atraumatic Lungs:  CTA B Normal respiratory effort, no intercostal retractions, no accessory muscle use. Heart: RRR,  no murmur.  No pretibial edema bilaterally  Abdomen:  Not distended, soft, non-tender. No rebound or rigidity.   Skin: Exposed areas without rash. Not pale. Not jaundice Neurologic:  alert & oriented X3.  Speech normal, gait appropriate for age and unassisted Strength symmetric and appropriate for age.  Psych: Cognition and judgment appear intact.  Cooperative with normal attention span and concentration.  Behavior appropriate. No anxious or depressed appearing.     Assessment & Plan:  Assessment HTN Anxiety, depression, insomnia Vitamin D deficiency Atypical mole, chest 02-2016 Birth control -- IUD  PLAN:  CP: See last visit, labs , CXR were neg HTN: Well-controlled on metoprolol, ambulatory BPs with diastolic in the 16X sometimes.  Recommend to continue checking ambulatory BPs.  See instructions. Anxiety, depression, insomnia: Since the last visit, restarted fluoxetine, feels better, still sees a counselor, needs a UDS and contract (Xanax).  RX sent  to our pharmacy due to cost Occasional   RUQ abd pain : sporadic, s/p cholecystectomy,  call if symptoms persist Fatigue: Patient sent a message after the visit, she is also feeling fatigue and sleepy.  At some point vitamin D and B12 were low.  Will recheck.  Reassess fatigue in 6 months, given BMI, OSA is in the DDX RTC  6 months

## 2018-03-09 ENCOUNTER — Encounter: Payer: Self-pay | Admitting: Internal Medicine

## 2018-03-09 NOTE — Assessment & Plan Note (Signed)
CP: See last visit, labs , CXR were neg HTN: Well-controlled on metoprolol, ambulatory BPs with diastolic in the 90S sometimes.  Recommend to continue checking ambulatory BPs.  See instructions. Anxiety, depression, insomnia: Since the last visit, restarted fluoxetine, feels better, still sees a counselor, needs a UDS and contract (Xanax).  RX sent  to our pharmacy due to cost Occasional   RUQ abd pain : sporadic, s/p cholecystectomy,  call if symptoms persist Fatigue: Patient sent a message after the visit, she is also feeling fatigue and sleepy.  At some point vitamin D and B12 were low.  Will recheck.  Reassess fatigue in 6 months, given BMI, OSA is in the DDX RTC  6 months

## 2018-03-10 ENCOUNTER — Other Ambulatory Visit (INDEPENDENT_AMBULATORY_CARE_PROVIDER_SITE_OTHER): Payer: 59

## 2018-03-10 DIAGNOSIS — Z0000 Encounter for general adult medical examination without abnormal findings: Secondary | ICD-10-CM

## 2018-03-10 LAB — LIPID PANEL
Cholesterol: 187 mg/dL (ref 0–200)
HDL: 47.7 mg/dL (ref >39.00)
LDL CALC: 116 mg/dL — AB (ref 0–99)
NONHDL: 138.91
Total CHOL/HDL Ratio: 4
Triglycerides: 114 mg/dL (ref 0.0–149.0)
VLDL: 22.8 mg/dL (ref 0.0–40.0)

## 2018-03-10 LAB — COMPREHENSIVE METABOLIC PANEL
ALK PHOS: 60 U/L (ref 39–117)
ALT: 14 U/L (ref 0–35)
AST: 12 U/L (ref 0–37)
Albumin: 4.1 g/dL (ref 3.5–5.2)
BILIRUBIN TOTAL: 0.5 mg/dL (ref 0.2–1.2)
BUN: 10 mg/dL (ref 6–23)
CALCIUM: 9.6 mg/dL (ref 8.4–10.5)
CO2: 26 mEq/L (ref 19–32)
CREATININE: 0.73 mg/dL (ref 0.40–1.20)
Chloride: 104 mEq/L (ref 96–112)
GFR: 91.07 mL/min (ref >60.00)
GLUCOSE: 121 mg/dL — AB (ref 70–99)
Potassium: 5 mEq/L (ref 3.5–5.1)
Sodium: 138 mEq/L (ref 135–145)
TOTAL PROTEIN: 7.8 g/dL (ref 6.0–8.3)

## 2018-03-10 LAB — VITAMIN D 25 HYDROXY (VIT D DEFICIENCY, FRACTURES): VITD: 18.59 ng/mL — ABNORMAL LOW (ref 30.00–100.00)

## 2018-03-10 LAB — HEMOGLOBIN A1C: Hgb A1c MFr Bld: 5.7 % (ref 4.6–6.5)

## 2018-03-10 LAB — VITAMIN B12: Vitamin B-12: 164 pg/mL — ABNORMAL LOW (ref 211–911)

## 2018-03-10 NOTE — Addendum Note (Signed)
Addended byDamita Dunnings D on: 03/10/2018 01:15 PM   Modules accepted: Orders

## 2018-03-11 MED ORDER — VITAMIN D (ERGOCALCIFEROL) 1.25 MG (50000 UNIT) PO CAPS: 50000.0000 [IU] | ORAL_CAPSULE | ORAL | 3 refills | Status: DC

## 2018-03-12 LAB — PAIN MGMT, PROFILE 8 W/CONF, U
6 ACETYLMORPHINE: NEGATIVE ng/mL (ref <10)
ALPHAHYDROXYMIDAZOLAM: NEGATIVE ng/mL (ref <50)
Alcohol Metabolites: NEGATIVE ng/mL (ref <500)
Alphahydroxyalprazolam: 42 ng/mL — ABNORMAL HIGH (ref <25)
Alphahydroxytriazolam: NEGATIVE ng/mL (ref <50)
Aminoclonazepam: NEGATIVE ng/mL (ref <25)
Amphetamines: NEGATIVE ng/mL (ref <500)
BENZODIAZEPINES: POSITIVE ng/mL — AB (ref <100)
Buprenorphine, Urine: NEGATIVE ng/mL (ref <5)
Cocaine Metabolite: NEGATIVE ng/mL (ref <150)
Creatinine: 51.3 mg/dL
HYDROXYETHYLFLURAZEPAM: NEGATIVE ng/mL (ref <50)
Lorazepam: NEGATIVE ng/mL (ref <50)
MARIJUANA METABOLITE: NEGATIVE ng/mL (ref <20)
MDMA: NEGATIVE ng/mL (ref <500)
NORDIAZEPAM: NEGATIVE ng/mL (ref <50)
OPIATES: NEGATIVE ng/mL (ref <100)
OXYCODONE: NEGATIVE ng/mL (ref <100)
Oxazepam: NEGATIVE ng/mL (ref <50)
Oxidant: NEGATIVE ug/mL (ref <200)
Temazepam: NEGATIVE ng/mL (ref <50)
pH: 6.62 (ref 4.5–9.0)

## 2018-03-19 ENCOUNTER — Telehealth: Payer: Self-pay | Admitting: Internal Medicine

## 2018-03-22 NOTE — Telephone Encounter (Signed)
Pt is requesting refill on alprazolam.   Last OV: 03/08/2018 Last Fill: 12/14/2017 #30 and 1rf UDS: 03/08/2018 Low risk  NCCR printed- 12/14/2017- no discrepancies noted- in media  Please advise.

## 2018-03-22 NOTE — Telephone Encounter (Signed)
sent 

## 2018-04-02 ENCOUNTER — Encounter: Payer: Self-pay | Admitting: Internal Medicine

## 2018-05-07 DIAGNOSIS — M67911 Unspecified disorder of synovium and tendon, right shoulder: Secondary | ICD-10-CM | POA: Diagnosis not present

## 2018-05-07 DIAGNOSIS — M7712 Lateral epicondylitis, left elbow: Secondary | ICD-10-CM | POA: Diagnosis not present

## 2018-05-17 ENCOUNTER — Encounter: Payer: Self-pay | Admitting: Internal Medicine

## 2018-05-18 ENCOUNTER — Other Ambulatory Visit: Payer: Self-pay | Admitting: Internal Medicine

## 2018-06-23 DIAGNOSIS — Z1231 Encounter for screening mammogram for malignant neoplasm of breast: Secondary | ICD-10-CM | POA: Diagnosis not present

## 2018-06-23 LAB — HM MAMMOGRAPHY

## 2018-06-25 ENCOUNTER — Encounter: Payer: Self-pay | Admitting: Internal Medicine

## 2018-07-03 IMAGING — DX DG CHEST 2V
2 series · 2 of 2 positions shown · non-contrast
Comparison: None.

CLINICAL DATA: Chest pain

EXAM:
CHEST  2 VIEW

[chest pa]
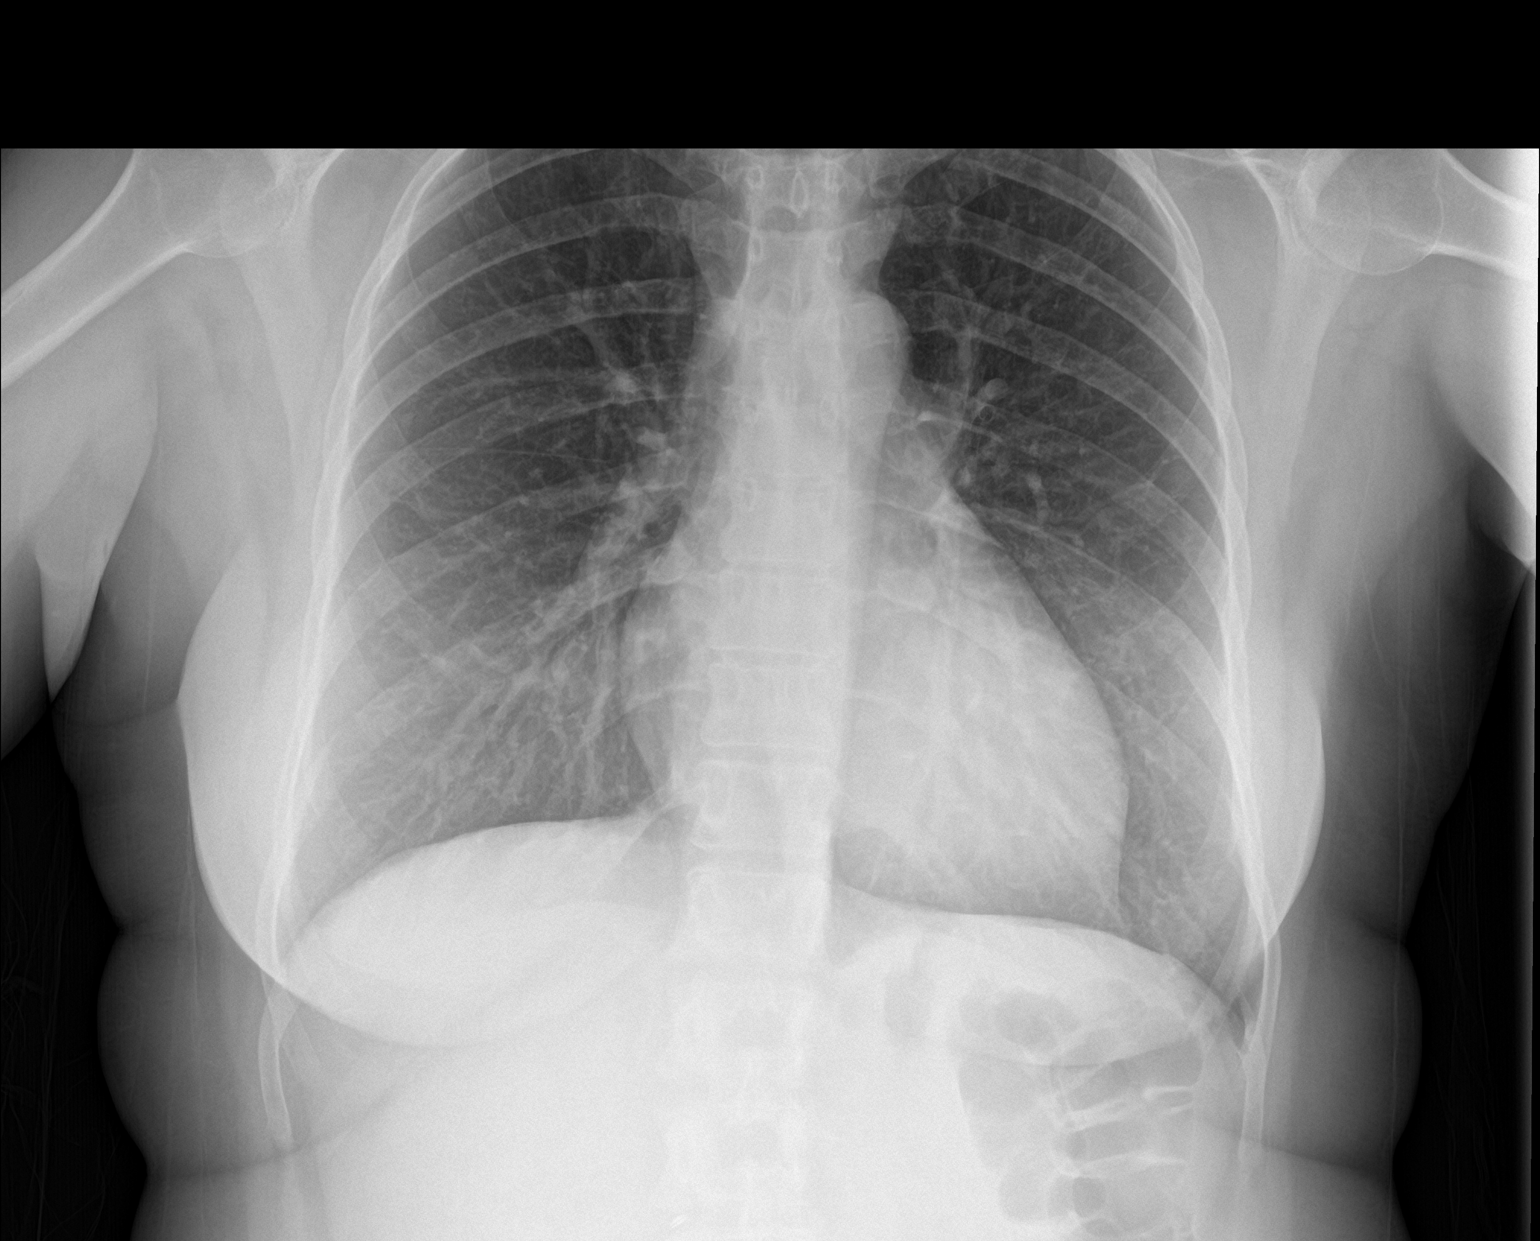

[chest lat]
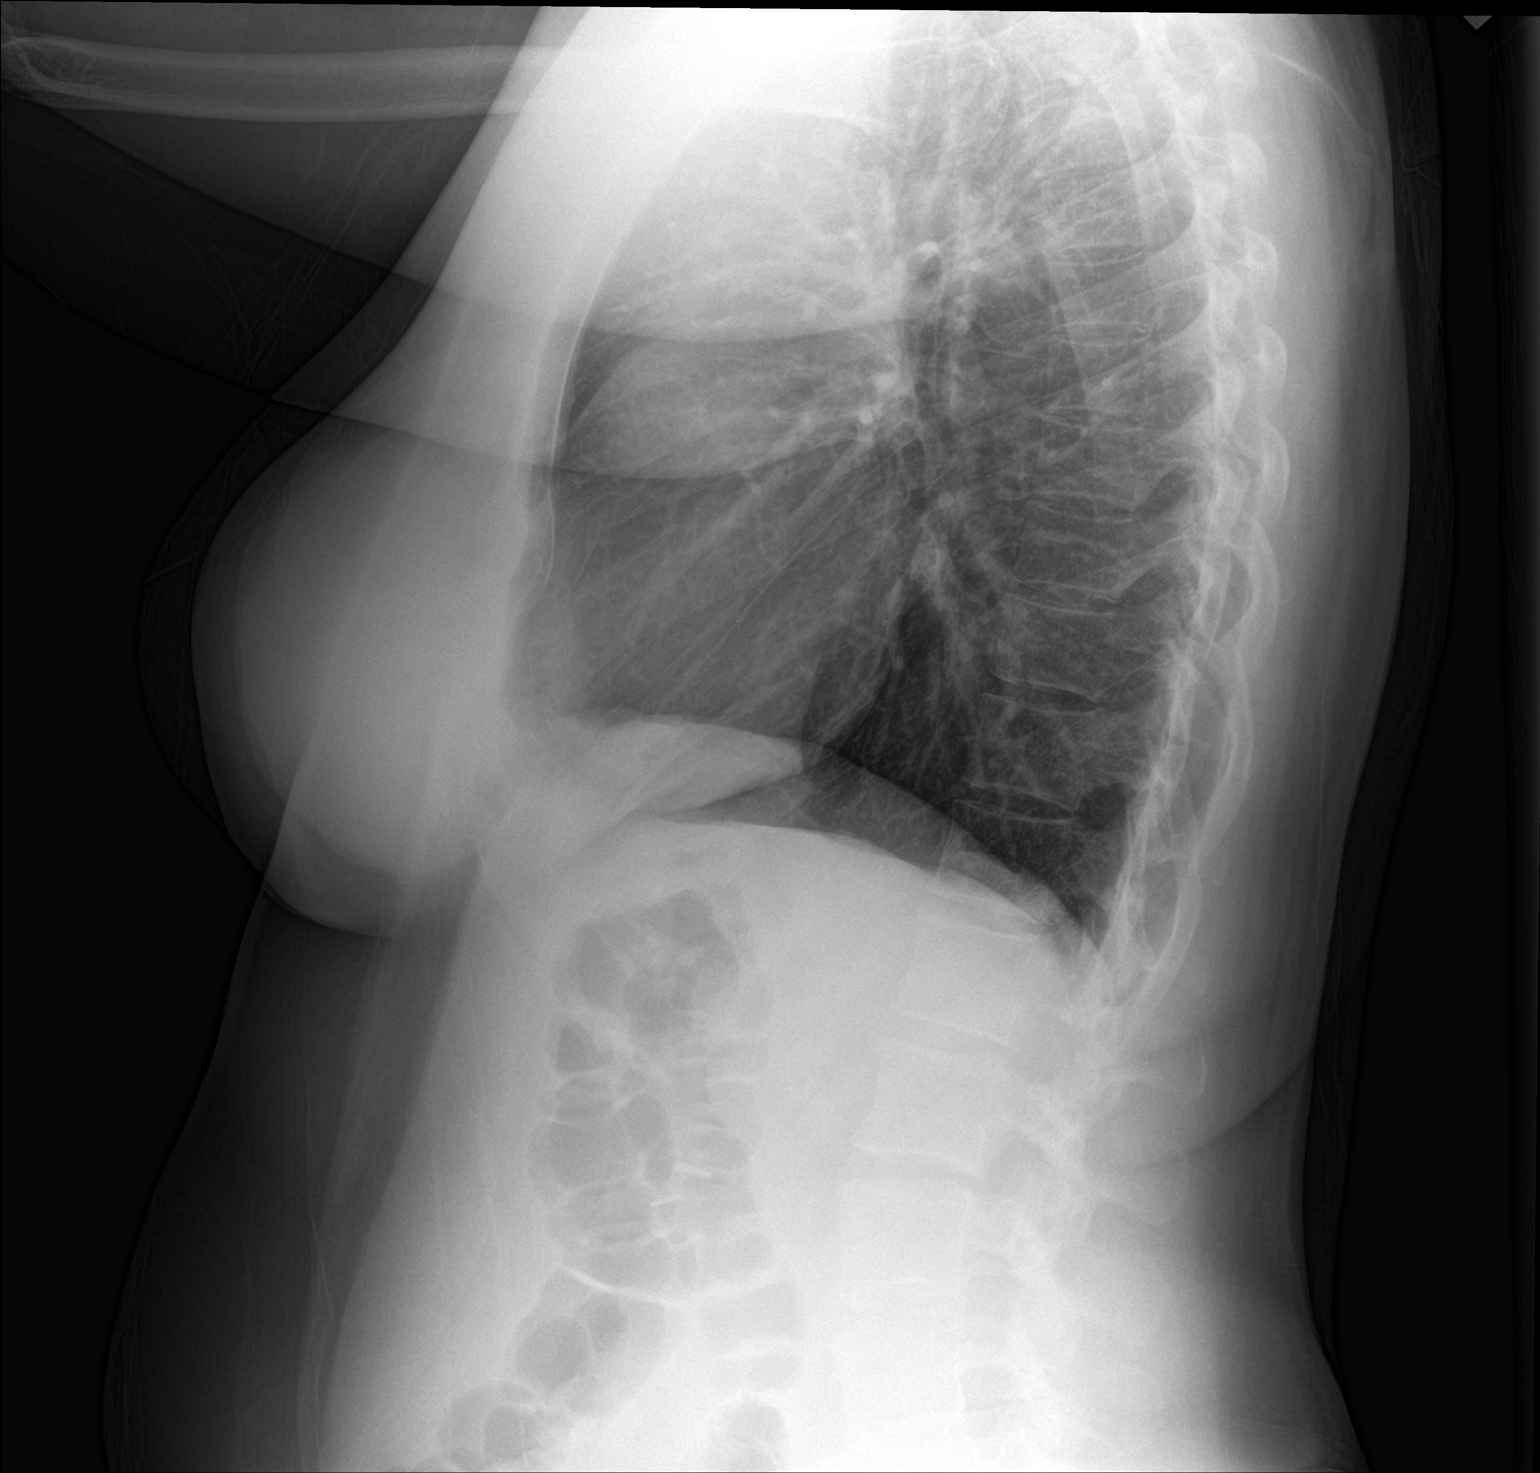

[2 of 2 positions shown; findings below may reference images not displayed]

FINDINGS: Lungs are clear. Heart size and pulmonary vascularity are normal. No
adenopathy. No pneumothorax. No bone lesions.
IMPRESSION: No edema or consolidation.

## 2018-08-20 ENCOUNTER — Other Ambulatory Visit: Payer: Self-pay | Admitting: Internal Medicine

## 2018-08-20 NOTE — Telephone Encounter (Signed)
Pt is requesting refill on alprazolam.   Last OV: 03/08/2018 Last Fill: 03/22/2018 #30 and 2RF UDS: 03/08/2018 Low risk  NCCR down.

## 2018-08-23 MED ORDER — ALPRAZOLAM 0.5 MG PO TABS: 0.5000 mg | ORAL_TABLET | Freq: Every evening | ORAL | 1 refills | Status: DC | PRN

## 2018-08-23 NOTE — Addendum Note (Signed)
Addended by: Kathlene November E on: 08/23/2018 12:38 PM   Modules accepted: Orders

## 2018-08-23 NOTE — Telephone Encounter (Signed)
Sent!

## 2018-09-08 ENCOUNTER — Ambulatory Visit: Payer: 59 | Admitting: Internal Medicine

## 2018-09-13 DIAGNOSIS — Z01419 Encounter for gynecological examination (general) (routine) without abnormal findings: Secondary | ICD-10-CM | POA: Diagnosis not present

## 2018-09-13 DIAGNOSIS — Z6834 Body mass index (BMI) 34.0-34.9, adult: Secondary | ICD-10-CM | POA: Diagnosis not present

## 2018-09-13 DIAGNOSIS — N920 Excessive and frequent menstruation with regular cycle: Secondary | ICD-10-CM | POA: Diagnosis not present

## 2018-09-13 DIAGNOSIS — R32 Unspecified urinary incontinence: Secondary | ICD-10-CM | POA: Diagnosis not present

## 2018-10-07 DIAGNOSIS — D251 Intramural leiomyoma of uterus: Secondary | ICD-10-CM | POA: Diagnosis not present

## 2018-10-07 DIAGNOSIS — N3941 Urge incontinence: Secondary | ICD-10-CM | POA: Diagnosis not present

## 2018-11-11 DIAGNOSIS — R87619 Unspecified abnormal cytological findings in specimens from cervix uteri: Secondary | ICD-10-CM | POA: Diagnosis not present

## 2018-11-16 ENCOUNTER — Other Ambulatory Visit: Payer: Self-pay | Admitting: Internal Medicine

## 2018-12-31 ENCOUNTER — Encounter: Payer: Self-pay | Admitting: Internal Medicine

## 2018-12-31 ENCOUNTER — Telehealth: Payer: Self-pay | Admitting: Internal Medicine

## 2018-12-31 NOTE — Telephone Encounter (Signed)
Called pt. to schedule an appt., pre recommendation Dr. Larose Kells.  The pt. stated she has had increased BP of 140/90, 135/89 when dose of Metoprolol wears off.  Stated "I don't think it's severe, I just wanted to see if my dose needs adjusted."  Denied any chest pain, headache or heart palpitations at present time.  Appt. Scheduled at 8:00 AM on 01/03/19.  Advised, per Dr. Larose Kells, to go to ER if symptoms worsen; ie: chest pain, shortness of breath or fast heart rate.  Pt. Verb. Understanding.

## 2018-12-31 NOTE — Telephone Encounter (Signed)
Patient  sent a message in [place], she reports some chest pain, headache and palpitations for few weeks. She needs to be seen ASAP. Please call and make appointment If she reports severe symptoms, difficulty breathing:  Needs to go to the ER

## 2019-01-01 NOTE — Progress Notes (Signed)
[place] at [facility] [address], [place], [place] [address] [phone]  Date:  01/03/2019   Name:  Cassidy Shepard   DOB:  11-09-1970   MRN:  [id number]  PCP:  Colon Branch, MD    Chief Complaint: Chest Pain (pressure in chest, heart beats in neck, palpitations, headache, few weeks)   History of Present Illness:  Cassidy Shepard is a 48 y.o. very pleasant female patient who presents with the following:  Patient of Dr. Larose Kells, history of iron deficiency and vitamin D deficiency, anxiety and depression. I have not seen this patient in the past Here today with concern of heart palpitations and headache She notes that over the last month she may feel her "heart beating in the veins of my head and neck" Also, she sometimes feels a pressure in her chest "like I need to cough" which has been present for over a month This is non exertional. May occur once a day, lasts "until I take my medication" - metoprolol No SOB, no fever No history of CAD, no family history except her mom has HTN  She is checking her BP at home and notes that she is running higher than she would expect Has measured as high as 138/100 which concerns her  She is taking metoprolol BID- she has been on this for 2-3 years  Asked about stressors- she is both working and in school online  BP Readings from Last 3 Encounters:  01/03/19 118/88  03/08/18 126/74  09/09/17 136/78   She is not a smoker  Patient Active Problem List   Diagnosis Date Noted  . HTN (hypertension) 09/17/2016  . PCP NOTES >>>>>>>>>>>>>>>>>>> 02/26/2016  . Unspecified vitamin D deficiency 05/22/2014  . Annual physical exam 11/11/2013  . Iron deficiency 09/01/2011  . Anxiety and depression, insomnia 10/23/2009  . FATIGUE 10/23/2009    Past Medical History:  Diagnosis Date  . Anxiety and depression, insomnia 10/23/2009  . Unspecified vitamin D deficiency 05/22/2014    Past Surgical History:  Procedure  Laterality Date  . APPENDECTOMY    . CESAREAN SECTION     x 2  . CHOLECYSTECTOMY    . INTRAUTERINE DEVICE INSERTION  july 2016  . SKIN SURGERY  02/20/16    Social History   Tobacco Use  . Smoking status: Former Research scientist (life sciences)  . Smokeless tobacco: Never Used  . Tobacco comment: smoke a little as a teenager   Substance Use Topics  . Alcohol use: Yes    Alcohol/week: 0.0 standard drinks    Comment: socially   . Drug use: No    Family History  Problem Relation Age of Onset  . Diabetes Other        GF  . Colon cancer Neg Hx   . Breast cancer Neg Hx   . CAD Neg Hx   . Skin cancer Neg Hx     Allergies  Allergen Reactions  . Sulfonamide Derivatives     REACTION: rash    Medication list has been reviewed and updated.  Current Outpatient Medications on File Prior to Visit  Medication Sig Dispense Refill  . ALPRAZolam (XANAX) 0.5 MG tablet Take 1 tablet (0.5 mg total) by mouth at bedtime as needed. for sleep 30 tablet 1  . FLUoxetine (PROZAC) 20 MG capsule Take 20 mg by mouth daily.    . metoprolol tartrate (LOPRESSOR) 25 MG tablet Take 1 tablet (25 mg total) by  mouth 2 (two) times daily. 60 tablet 0  . PARAGARD INTRAUTERINE COPPER IUD IUD 1 each by Intrauterine route once.    . Vitamin D, Ergocalciferol, (DRISDOL) 50000 units CAPS capsule Take 1 capsule (50,000 Units total) by mouth every 7 (seven) days. 4 capsule 3   No current facility-administered medications on file prior to visit.     Review of Systems:  As per HPI- otherwise negative.   Physical Examination: Vitals:   01/03/19 0918  BP: 118/88  Pulse: 79  Resp: 16  Temp: 98.6 F (37 C)  SpO2: 100%   Vitals:   01/03/19 0918  Weight: 198 lb (89.8 kg)  Height: 5\' 4"  (1.626 m)   Body mass index is 33.99 kg/m. Ideal Body Weight: Weight in (lb) to have BMI = 25: 145.3  GEN: WDWN, NAD, Non-toxic, A & O x 3, obese, looks well  HEENT: Atraumatic, Normocephalic. Neck supple. No masses, No LAD.  Bilateral TM wnl,  oropharynx normal.  PEERL,EOMI.   Ears and Nose: No external deformity. CV: RRR, No M/G/R. No JVD. No thrill. No extra heart sounds. PULM: CTA B, no wheezes, crackles, rhonchi. No retractions. No resp. distress. No accessory muscle use. EXTR: No c/c/e NEURO Normal gait.  PSYCH: Normally interactive. Conversant. Not depressed or anxious appearing.  Calm demeanor.   EKG- NSR  EKG is compared to previous tracing from 2018- no significant change noted  Assessment and Plan: Palpitations - Plan: EKG 12-Lead, TSH  Hypertension, unspecified type - Plan: amLODipine (NORVASC) 2.5 MG tablet  Chest tightness - Plan: Troponin I -  Screening for hyperlipidemia - Plan: Lipid panel  Screening for diabetes mellitus - Plan: Comprehensive metabolic panel, Hemoglobin A1c  Screening for deficiency anemia - Plan: CBC  Need for immunization against influenza - Plan: Flu Vaccine QUAD 6+ mos PF IM (Fluarix Quad PF)  Here today with concern of occasional chest pressure and a feeling of her heart beating in her head and neck.  Certainly her symptoms are not typical of CAD.  EKG is normal today, I will do a troponin as well She has noted elevated BP at home  Will add 2.5 mg of amlodipine; will not increase BB so as not to decrease her pulse Her blood pressure is okay here, so will titrate her medications very gently She wonders if weight loss might help to control her blood pressure, I advised her that likely her blood pressure may come down if she is able to lose weight Will do other lab work for her as above Troponin- expect this will be negative   Signed Lamar Blinks, MD  Received her labs- message to pt   Results for orders placed or performed in visit on 01/03/19  CBC  Result Value Ref Range   WBC 8.8 4.0 - 10.5 K/uL   RBC 4.44 3.87 - 5.11 Mil/uL   Platelets 334.0 150.0 - 400.0 K/uL   Hemoglobin 12.9 12.0 - 15.0 g/dL   HCT 38.9 36.0 - 46.0 %   MCV 87.7 78.0 - 100.0 fl   MCHC 33.1 30.0 -  36.0 g/dL   RDW 13.7 11.5 - 15.5 %  Comprehensive metabolic panel  Result Value Ref Range   Sodium 137 135 - 145 mEq/L   Potassium 4.5 3.5 - 5.1 mEq/L   Chloride 103 96 - 112 mEq/L   CO2 26 19 - 32 mEq/L   Glucose, Bld 112 (H) 70 - 99 mg/dL   BUN 9 6 - 23 mg/dL  Creatinine, Ser 0.72 0.40 - 1.20 mg/dL   Total Bilirubin 0.3 0.2 - 1.2 mg/dL   Alkaline Phosphatase 59 39 - 117 U/L   AST 13 0 - 37 U/L   ALT 15 0 - 35 U/L   Total Protein 8.0 6.0 - 8.3 g/dL   Albumin 4.1 3.5 - 5.2 g/dL   Calcium 9.4 8.4 - 10.5 mg/dL   GFR 86.75 >60.00 mL/min  Hemoglobin A1c  Result Value Ref Range   Hgb A1c MFr Bld 5.8 4.6 - 6.5 %  Lipid panel  Result Value Ref Range   Cholesterol 186 0 - 200 mg/dL   Triglycerides 131.0 0.0 - 149.0 mg/dL   HDL 41.50 >39.00 mg/dL   VLDL 26.2 0.0 - 40.0 mg/dL   LDL Cholesterol 119 (H) 0 - 99 mg/dL   Total CHOL/HDL Ratio 4    NonHDL 144.96   TSH  Result Value Ref Range   TSH 1.65 0.35 - 4.50 uIU/mL  Troponin I -  Result Value Ref Range   TNIDX 0.03 0.00 - 0.06 ug/l

## 2019-01-03 ENCOUNTER — Encounter: Payer: Self-pay | Admitting: Family Medicine

## 2019-01-03 ENCOUNTER — Ambulatory Visit: Payer: 59 | Admitting: Internal Medicine

## 2019-01-03 ENCOUNTER — Other Ambulatory Visit: Payer: Self-pay

## 2019-01-03 ENCOUNTER — Ambulatory Visit: Payer: 59 | Admitting: Family Medicine

## 2019-01-03 VITALS — BP 118/88 | HR 79 | Temp 98.6°F | Resp 16 | Ht 64.0 in | Wt 198.0 lb

## 2019-01-03 DIAGNOSIS — Z23 Encounter for immunization: Secondary | ICD-10-CM | POA: Diagnosis not present

## 2019-01-03 DIAGNOSIS — Z130 Encounter for screening for diseases of the blood and blood-forming organs and certain disorders involving the immune mechanism: Secondary | ICD-10-CM | POA: Diagnosis not present

## 2019-01-03 DIAGNOSIS — I10 Essential (primary) hypertension: Secondary | ICD-10-CM

## 2019-01-03 DIAGNOSIS — R0789 Other chest pain: Secondary | ICD-10-CM

## 2019-01-03 DIAGNOSIS — Z131 Encounter for screening for diabetes mellitus: Secondary | ICD-10-CM | POA: Diagnosis not present

## 2019-01-03 DIAGNOSIS — Z13220 Encounter for screening for lipoid disorders: Secondary | ICD-10-CM | POA: Diagnosis not present

## 2019-01-03 DIAGNOSIS — R002 Palpitations: Secondary | ICD-10-CM | POA: Diagnosis not present

## 2019-01-03 LAB — COMPREHENSIVE METABOLIC PANEL
ALBUMIN: 4.1 g/dL (ref 3.5–5.2)
ALK PHOS: 59 U/L (ref 39–117)
ALT: 15 U/L (ref 0–35)
AST: 13 U/L (ref 0–37)
BILIRUBIN TOTAL: 0.3 mg/dL (ref 0.2–1.2)
BUN: 9 mg/dL (ref 6–23)
CO2: 26 mEq/L (ref 19–32)
Calcium: 9.4 mg/dL (ref 8.4–10.5)
Chloride: 103 mEq/L (ref 96–112)
Creatinine, Ser: 0.72 mg/dL (ref 0.40–1.20)
GFR: 86.75 mL/min (ref >60.00)
Glucose, Bld: 112 mg/dL — ABNORMAL HIGH (ref 70–99)
Potassium: 4.5 mEq/L (ref 3.5–5.1)
Sodium: 137 mEq/L (ref 135–145)
TOTAL PROTEIN: 8 g/dL (ref 6.0–8.3)

## 2019-01-03 LAB — LIPID PANEL
CHOL/HDL RATIO: 4
Cholesterol: 186 mg/dL (ref 0–200)
HDL: 41.5 mg/dL (ref >39.00)
LDL Cholesterol: 119 mg/dL — ABNORMAL HIGH (ref 0–99)
NONHDL: 144.96
TRIGLYCERIDES: 131 mg/dL (ref 0.0–149.0)
VLDL: 26.2 mg/dL (ref 0.0–40.0)

## 2019-01-03 LAB — CBC
HCT: 38.9 % (ref 36.0–46.0)
HEMOGLOBIN: 12.9 g/dL (ref 12.0–15.0)
MCHC: 33.1 g/dL (ref 30.0–36.0)
MCV: 87.7 fl (ref 78.0–100.0)
PLATELETS: 334 10*3/uL (ref 150.0–400.0)
RBC: 4.44 Mil/uL (ref 3.87–5.11)
RDW: 13.7 % (ref 11.5–15.5)
WBC: 8.8 10*3/uL (ref 4.0–10.5)

## 2019-01-03 LAB — TSH: TSH: 1.65 u[IU]/mL (ref 0.35–4.50)

## 2019-01-03 LAB — HEMOGLOBIN A1C: HEMOGLOBIN A1C: 5.8 % (ref 4.6–6.5)

## 2019-01-03 LAB — TROPONIN I: TNIDX: 0.03 ug/l (ref 0.00–0.06)

## 2019-01-03 MED ORDER — AMLODIPINE BESYLATE 2.5 MG PO TABS
2.5000 mg | ORAL_TABLET | Freq: Every day | ORAL | 3 refills | Status: DC
Start: 2019-01-03 — End: 2019-03-15

## 2019-01-03 NOTE — Patient Instructions (Signed)
It was good to see you today.  We will do some lab work today to look for any other cause of your symptoms. I will also do a troponin, this is a blood test which look for any damage to the heart muscle.  We will get this back today Let us add 2.5 mg of amlodipine to your current metoprolol.  This will help to bring down your blood pressure a touch without slowing of heart rate.  I will be in touch with your labs, please keep [place] posted about your symptoms over the next 1 to 2 weeks.  If you are getting worse, or have any persistent chest pain or shortness of breath please seek care immediately

## 2019-01-05 ENCOUNTER — Telehealth: Payer: Self-pay | Admitting: Internal Medicine

## 2019-01-05 ENCOUNTER — Other Ambulatory Visit: Payer: Self-pay | Admitting: Internal Medicine

## 2019-01-05 NOTE — Telephone Encounter (Signed)
sent 

## 2019-01-05 NOTE — Telephone Encounter (Signed)
Pt is requesting refill on alprazolam.   Last OV: 01/03/2019 w/ Dr. Lorelei Pont Last Fill: 08/23/2018 #30 and 1RF UDS: 03/08/2018 Low risk

## 2019-03-03 ENCOUNTER — Encounter: Payer: Self-pay | Admitting: Internal Medicine

## 2019-03-03 ENCOUNTER — Encounter: Payer: Self-pay | Admitting: Family Medicine

## 2019-03-04 ENCOUNTER — Other Ambulatory Visit: Payer: Self-pay | Admitting: Family Medicine

## 2019-03-15 ENCOUNTER — Encounter: Payer: Self-pay | Admitting: Internal Medicine

## 2019-03-15 ENCOUNTER — Ambulatory Visit (INDEPENDENT_AMBULATORY_CARE_PROVIDER_SITE_OTHER): Payer: 59 | Admitting: Internal Medicine

## 2019-03-15 ENCOUNTER — Other Ambulatory Visit: Payer: Self-pay

## 2019-03-15 DIAGNOSIS — F32A Depression, unspecified: Secondary | ICD-10-CM

## 2019-03-15 DIAGNOSIS — I10 Essential (primary) hypertension: Secondary | ICD-10-CM

## 2019-03-15 DIAGNOSIS — F329 Major depressive disorder, single episode, unspecified: Secondary | ICD-10-CM | POA: Diagnosis not present

## 2019-03-15 DIAGNOSIS — F419 Anxiety disorder, unspecified: Secondary | ICD-10-CM

## 2019-03-15 DIAGNOSIS — E669 Obesity, unspecified: Secondary | ICD-10-CM

## 2019-03-15 MED ORDER — ALPRAZOLAM 0.5 MG PO TABS: 0.5000 mg | ORAL_TABLET | Freq: Every evening | ORAL | 2 refills | Status: DC | PRN

## 2019-03-15 MED ORDER — AMLODIPINE BESYLATE 5 MG PO TABS: 5.0000 mg | ORAL_TABLET | Freq: Every day | ORAL | 6 refills | Status: DC

## 2019-03-15 NOTE — Progress Notes (Signed)
Subjective:    Patient ID: Cassidy Shepard, female    DOB: 01-19-1971, 48 y.o.   MRN: [id number]  DOS:  03/15/2019 Type of visit - description: Virtual Visit via Video Note  I connected with@ on 03/16/19 at  3:40 PM EDT by a video enabled telemedicine application and verified that I am speaking with the correct person using two identifiers.   THIS ENCOUNTER IS A VIRTUAL VISIT DUE TO COVID-19 - PATIENT WAS NOT SEEN IN THE OFFICE. PATIENT HAS CONSENTED TO VIRTUAL VISIT / TELEMEDICINE VISIT   Location of patient: home  Location of provider: office  I discussed the limitations of evaluation and management by telemedicine and the availability of in person appointments. The patient expressed understanding and agreed to proceed.  History of Present Illness: Routine visit In general feeling well HTN: Diastolic BP is still slightly elevated despite amlodipine being added few months ago. COVID-19: She has been working from home but now her office has open, she is somewhat concerned about being around other people due to her risk factors. Insomnia: Controlled on Xanax Depression: Self stopped fluoxetine, no depression.  Obesity: Has been able to lose 12 pounds   Review of Systems Denies fever chills No chest pain no difficulty breathing No palpitations No cough  Past Medical History:  Diagnosis Date  . Anxiety and depression, insomnia 10/23/2009  . Unspecified vitamin D deficiency 05/22/2014    Past Surgical History:  Procedure Laterality Date  . APPENDECTOMY    . CESAREAN SECTION     x 2  . CHOLECYSTECTOMY    . INTRAUTERINE DEVICE INSERTION  july 2016  . SKIN SURGERY  02/20/16    Social History   Socioeconomic History  . Marital status: Divorced    Spouse name: Not on file  . Number of children: 2  . Years of education: Not on file  . Highest education level: Not on file  Occupational History  . Occupation: works for the city of [employer]: city of [employer]  Social  Needs  . Financial resource strain: Not on file  . Food insecurity:    Worry: Not on file    Inability: Not on file  . Transportation needs:    Medical: Not on file    Non-medical: Not on file  Tobacco Use  . Smoking status: Former Research scientist (life sciences)  . Smokeless tobacco: Never Used  . Tobacco comment: smoke a little as a teenager   Substance and Sexual Activity  . Alcohol use: Yes    Alcohol/week: 0.0 standard drinks    Comment: socially   . Drug use: No  . Sexual activity: Not on file  Lifestyle  . Physical activity:    Days per week: Not on file    Minutes per session: Not on file  . Stress: Not on file  Relationships  . Social connections:    Talks on phone: Not on file    Gets together: Not on file    Attends religious service: Not on file    Active member of club or organization: Not on file    Attends meetings of clubs or organizations: Not on file    Relationship status: Not on file  . Intimate partner violence:    Fear of current or ex partner: Not on file    Emotionally abused: Not on file    Physically abused: Not on file    Forced sexual activity: Not on file  Other Topics Concern  . Not  on file  Social History Narrative   Married, separated , moved to her own place ~ 11-2014   2 daughters 2001, 2005, shared custody   Original from [place]          Allergies as of 03/15/2019      Reactions   Sulfonamide Derivatives    REACTION: rash      Medication List       Accurate as of Mar 15, 2019 11:59 PM. If you have any questions, ask your nurse or doctor.        STOP taking these medications   FLUoxetine 20 MG capsule Commonly known as:  PROZAC Stopped by:  Kathlene November, MD   Vitamin D (Ergocalciferol) 1.25 MG (50000 UT) Caps capsule Commonly known as:  DRISDOL Stopped by:  Kathlene November, MD     TAKE these medications   ALPRAZolam 0.5 MG tablet Commonly known as:  XANAX Take 1 tablet (0.5 mg total) by mouth at bedtime as needed for sleep. What changed:  See  the new instructions. Changed by:  Kathlene November, MD   amLODipine 5 MG tablet Commonly known as:  NORVASC Take 1 tablet (5 mg total) by mouth daily. What changed:    medication strength  how much to take Changed by:  Kathlene November, MD   cholecalciferol 25 MCG (1000 UT) tablet Commonly known as:  VITAMIN D3 Take 1,000 Units by mouth daily.   metoprolol tartrate 25 MG tablet Commonly known as:  LOPRESSOR Take 1 tablet (25 mg total) by mouth 2 (two) times daily.   paragard Iud IUD 1 each by Intrauterine route once.           Objective:   Physical Exam There were no vitals taken for this visit. This is a virtual video visit.  Patient is alert oriented x3, no apparent distress    Assessment     Assessment HTN Anxiety, depression, insomnia Vitamin D deficiency Atypical mole, chest 02-2016 Birth control -- IUD  PLAN:  HTN: Currently on metoprolol and amlodipine 2.5 mg, ambulatory BPs 960/45, diastolic BP reportedly  consistently elevated.  Will increase amlodipine to 5 mg and reassess in 3 months.  Recommend to monitor BPs. Anxiety, depression, insomnia: Self discontinued fluoxetine several months ago, currently managing well with Xanax at bedtime, she is a still somewhat apprehensive about COVID-19, see below.  RF Xanax today. Chest pain/ palpitations: Not an issue at this point COVID-19: The patient has hypertension and obesity, she is somewhat afraid to go back to work on a office environment, would like to stay at home and work remotely, that is okay, Dr. Lorelei Pont already wrote a letter to that effect but if she needs another letter she will let me know. Vitamin D deficiency: Status post ergocalciferol, recommend OTC vitamin D 1000 units daily RTC 3 months physical exam     I discussed the assessment and treatment plan with the patient. The patient was provided an opportunity to ask questions and all were answered. The patient agreed with the plan and demonstrated an  understanding of the instructions.   The patient was advised to call back or seek an in-person evaluation if the symptoms worsen or if the condition fails to improve as anticipated.

## 2019-03-16 ENCOUNTER — Telehealth: Payer: Self-pay | Admitting: Internal Medicine

## 2019-03-16 DIAGNOSIS — E669 Obesity, unspecified: Secondary | ICD-10-CM | POA: Insufficient documentation

## 2019-03-16 NOTE — Telephone Encounter (Signed)
Return in about 3 months (around 06/15/2019) for physical exam, fasting.  Called left msg for pat to call back to make cpe in august

## 2019-03-16 NOTE — Assessment & Plan Note (Signed)
HTN: Currently on metoprolol and amlodipine 2.5 mg, ambulatory BPs 967/28, diastolic BP reportedly  consistently elevated.  Will increase amlodipine to 5 mg and reassess in 3 months.  Recommend to monitor BPs. Anxiety, depression, insomnia: Self discontinued fluoxetine several months ago, currently managing well with Xanax at bedtime, she is a still somewhat apprehensive about COVID-19, see below.  RF Xanax today. Chest pain/ palpitations: Not an issue at this point COVID-19: The patient has hypertension and obesity, she is somewhat afraid to go back to work on a office environment, would like to stay at home and work remotely, that is okay, Dr. Lorelei Pont already wrote a letter to that effect but if she needs another letter she will let me know. Vitamin D deficiency: Status post ergocalciferol, recommend OTC vitamin D 1000 units daily RTC 3 months physical exam

## 2019-05-14 ENCOUNTER — Other Ambulatory Visit: Payer: Self-pay | Admitting: Internal Medicine

## 2019-06-28 LAB — HM MAMMOGRAPHY

## 2019-07-05 ENCOUNTER — Encounter: Payer: Self-pay | Admitting: Internal Medicine

## 2019-08-10 ENCOUNTER — Encounter: Payer: Self-pay | Admitting: Family Medicine

## 2019-08-10 ENCOUNTER — Telehealth: Payer: Self-pay

## 2019-08-10 NOTE — Telephone Encounter (Signed)
Physical form completed and faxed to [facility] at [phone]. Form sent for scanning. Received fax confirmation.  

## 2019-09-06 ENCOUNTER — Other Ambulatory Visit: Payer: Self-pay | Admitting: Internal Medicine

## 2019-09-12 ENCOUNTER — Other Ambulatory Visit: Payer: Self-pay | Admitting: Internal Medicine

## 2019-10-10 ENCOUNTER — Other Ambulatory Visit: Payer: Self-pay

## 2019-10-10 MED ORDER — METOPROLOL TARTRATE 25 MG PO TABS: 25.0000 mg | ORAL_TABLET | Freq: Two times a day (BID) | ORAL | 0 refills | Status: DC

## 2019-11-07 ENCOUNTER — Other Ambulatory Visit: Payer: Self-pay | Admitting: Internal Medicine

## 2019-11-13 ENCOUNTER — Other Ambulatory Visit: Payer: Self-pay | Admitting: Internal Medicine

## 2019-11-29 ENCOUNTER — Other Ambulatory Visit: Payer: Self-pay | Admitting: Internal Medicine

## 2019-11-30 ENCOUNTER — Other Ambulatory Visit: Payer: Self-pay

## 2019-11-30 ENCOUNTER — Encounter: Payer: Self-pay | Admitting: Internal Medicine

## 2019-11-30 ENCOUNTER — Ambulatory Visit (INDEPENDENT_AMBULATORY_CARE_PROVIDER_SITE_OTHER): Payer: 59 | Admitting: Internal Medicine

## 2019-11-30 VITALS — BP 126/81 | Ht 64.0 in | Wt 188.0 lb

## 2019-11-30 DIAGNOSIS — F419 Anxiety disorder, unspecified: Secondary | ICD-10-CM

## 2019-11-30 DIAGNOSIS — F32A Depression, unspecified: Secondary | ICD-10-CM

## 2019-11-30 DIAGNOSIS — F329 Major depressive disorder, single episode, unspecified: Secondary | ICD-10-CM

## 2019-11-30 DIAGNOSIS — I10 Essential (primary) hypertension: Secondary | ICD-10-CM

## 2019-11-30 MED ORDER — METOPROLOL TARTRATE 25 MG PO TABS: 25.0000 mg | ORAL_TABLET | Freq: Two times a day (BID) | ORAL | 1 refills | Status: DC

## 2019-11-30 MED ORDER — AMLODIPINE BESYLATE 5 MG PO TABS: 5.0000 mg | ORAL_TABLET | Freq: Every day | ORAL | 1 refills | Status: DC

## 2019-11-30 NOTE — Progress Notes (Signed)
Pre visit review using our clinic review tool, if applicable. No additional management support is needed unless otherwise documented below in the visit note. 

## 2019-11-30 NOTE — Progress Notes (Signed)
Subjective:    Patient ID: Cassidy Shepard, female    DOB: 12-30-1970, 49 y.o.   MRN: [id number]  DOS:  11/30/2019 Type of visit - description: Virtual Visit via Video Note  I connected with the above patient  by a video enabled telemedicine application and verified that I am speaking with the correct person using two identifiers.   THIS ENCOUNTER IS A VIRTUAL VISIT DUE TO COVID-19 - PATIENT WAS NOT SEEN IN THE OFFICE. PATIENT HAS CONSENTED TO VIRTUAL VISIT / TELEMEDICINE VISIT   Location of patient: home  Location of provider: office  I discussed the limitations of evaluation and management by telemedicine and the availability of in person appointments. The patient expressed understanding and agreed to proceed.  Acute Last week, had a very stressful day at work, shortly after develop anterior chest pain, on and off, with no radiation. No associated sweats, shortness of breath, nausea, palpitations. Symptoms were when she was at work and also at night when she was quiet. Symptoms resolved within hours.  Due to the above she started to check her blood pressures and she is getting readings from 101/80 to  140/94.     Review of Systems See above   Past Medical History:  Diagnosis Date  . Anxiety and depression, insomnia 10/23/2009  . Unspecified vitamin D deficiency 05/22/2014    Past Surgical History:  Procedure Laterality Date  . APPENDECTOMY    . CESAREAN SECTION     x 2  . CHOLECYSTECTOMY    . INTRAUTERINE DEVICE INSERTION  july 2016  . SKIN SURGERY  02/20/16    Allergies as of 11/30/2019      Reactions   Sulfonamide Derivatives    REACTION: rash      Medication List       Accurate as of November 30, 2019 11:59 PM. If you have any questions, ask your nurse or doctor.        ALPRAZolam 0.5 MG tablet Commonly known as: XANAX Take 1 tablet (0.5 mg total) by mouth at bedtime as needed for sleep.   amLODipine 5 MG tablet Commonly known as: NORVASC Take 1 tablet (5 mg  total) by mouth daily.   cholecalciferol 25 MCG (1000 UNIT) tablet Commonly known as: VITAMIN D3 Take 1,000 Units by mouth daily.   metoprolol tartrate 25 MG tablet Commonly known as: LOPRESSOR Take 1 tablet (25 mg total) by mouth 2 (two) times daily.   paragard intrauterine copper Iud IUD 1 each by Intrauterine route once.             Objective:   Physical Exam BP 126/81   Ht 5\' 4"  (1.626 m)   Wt 188 lb (85.3 kg)   BMI 32.27 kg/m  This is a virtual video visit, she is alert oriented x3, no apparent distress    Assessment     Assessment HTN Anxiety, depression, insomnia Vitamin D deficiency Atypical mole, chest 02-2016 Birth control -- IUD  PLAN: HTN: Good compliance with amlodipine, metoprolol.  Ambulatory BPs range from normal to slightly elevated.  For now we agreed to continue present care, will RF her medications.  Goal is BP less than 135/85 consistently.  She is aware of that and will call me if he is not at goal Anxiety, depression, insomnia: Other than a very stressful day last week, she is doing okay, actually has not needed Xanax to sleep. She will call if Xanax is needed for a refill. Chest pain: As described above,  in the context of stress, for now recommend observation, ER if symptoms severe. RTC 3 months CPX, will call and schedule  I discussed the assessment and treatment plan with the patient. The patient was provided an opportunity to ask questions and all were answered. The patient agreed with the plan and demonstrated an understanding of the instructions.   The patient was advised to call back or seek an in-person evaluation if the symptoms worsen or if the condition fails to improve as anticipated.

## 2019-12-01 NOTE — Assessment & Plan Note (Signed)
HTN: Good compliance with amlodipine, metoprolol.  Ambulatory BPs range from normal to slightly elevated.  For now we agreed to continue present care, will RF her medications.  Goal is BP less than 135/85 consistently.  She is aware of that and will call me if he is not at goal Anxiety, depression, insomnia: Other than a very stressful day last week, she is doing okay, actually has not needed Xanax to sleep. She will call if Xanax is needed for a refill. Chest pain: As described above, in the context of stress, for now recommend observation, ER if symptoms severe. RTC 3 months CPX, will call and schedule

## 2019-12-29 ENCOUNTER — Ambulatory Visit: Payer: 59

## 2020-04-26 ENCOUNTER — Encounter: Payer: Self-pay | Admitting: Internal Medicine

## 2020-06-04 LAB — HM PAP SMEAR: HM Pap smear: NEGATIVE

## 2020-06-04 LAB — RESULTS CONSOLE HPV: CHL HPV: NEGATIVE

## 2020-06-10 ENCOUNTER — Other Ambulatory Visit: Payer: Self-pay | Admitting: Internal Medicine

## 2020-07-16 LAB — HM MAMMOGRAPHY

## 2020-07-17 ENCOUNTER — Encounter: Payer: Self-pay | Admitting: Internal Medicine

## 2020-07-18 ENCOUNTER — Other Ambulatory Visit: Payer: Self-pay | Admitting: Internal Medicine

## 2020-07-25 ENCOUNTER — Encounter: Payer: Self-pay | Admitting: Internal Medicine

## 2020-07-27 NOTE — Progress Notes (Signed)
Breast Ultrasound completed 10/062021-Benign cysts found. See consults.

## 2020-08-02 ENCOUNTER — Encounter: Payer: Self-pay | Admitting: Internal Medicine

## 2020-08-03 MED ORDER — AMLODIPINE BESYLATE 5 MG PO TABS: 5.0000 mg | ORAL_TABLET | Freq: Every day | ORAL | 1 refills | Status: DC

## 2020-08-03 MED ORDER — AMLODIPINE BESYLATE 5 MG PO TABS
5.0000 mg | ORAL_TABLET | Freq: Every day | ORAL | 1 refills | Status: DC
Start: 2020-08-03 — End: 2021-02-06

## 2020-08-03 MED ORDER — METOPROLOL TARTRATE 25 MG PO TABS
25.0000 mg | ORAL_TABLET | Freq: Two times a day (BID) | ORAL | 1 refills | Status: DC
Start: 2020-08-03 — End: 2020-08-03

## 2020-08-03 MED ORDER — METOPROLOL TARTRATE 25 MG PO TABS
25.0000 mg | ORAL_TABLET | Freq: Two times a day (BID) | ORAL | 1 refills | Status: DC
Start: 2020-08-03 — End: 2021-02-20

## 2020-08-03 NOTE — Telephone Encounter (Signed)
E-scribe down. Rx faxed to [facility]. Fax confirmation received.

## 2020-12-28 ENCOUNTER — Encounter: Payer: Self-pay | Admitting: Internal Medicine

## 2021-02-06 ENCOUNTER — Other Ambulatory Visit: Payer: Self-pay | Admitting: Internal Medicine

## 2021-02-17 ENCOUNTER — Other Ambulatory Visit: Payer: Self-pay | Admitting: Internal Medicine

## 2021-02-20 ENCOUNTER — Encounter: Payer: Self-pay | Admitting: Internal Medicine

## 2021-02-20 ENCOUNTER — Other Ambulatory Visit: Payer: Self-pay

## 2021-02-20 MED ORDER — METOPROLOL TARTRATE 25 MG PO TABS: 25.0000 mg | ORAL_TABLET | Freq: Two times a day (BID) | ORAL | 0 refills | Status: DC

## 2021-02-20 MED ORDER — METOPROLOL TARTRATE 25 MG PO TABS
25.0000 mg | ORAL_TABLET | Freq: Two times a day (BID) | ORAL | 0 refills | Status: DC
Start: 2021-02-20 — End: 2021-02-20

## 2021-02-20 MED ORDER — AMLODIPINE BESYLATE 5 MG PO TABS: 1.0000 | ORAL_TABLET | Freq: Every day | ORAL | 0 refills | Status: DC

## 2021-03-14 ENCOUNTER — Encounter: Payer: Self-pay | Admitting: Internal Medicine

## 2021-03-14 ENCOUNTER — Ambulatory Visit: Payer: 59 | Admitting: Internal Medicine

## 2021-03-14 ENCOUNTER — Other Ambulatory Visit: Payer: Self-pay

## 2021-03-14 VITALS — BP 115/76 | HR 68 | Temp 97.9°F | Ht 64.0 in | Wt 195.0 lb

## 2021-03-14 DIAGNOSIS — R1011 Right upper quadrant pain: Secondary | ICD-10-CM

## 2021-03-14 DIAGNOSIS — F419 Anxiety disorder, unspecified: Secondary | ICD-10-CM | POA: Diagnosis not present

## 2021-03-14 DIAGNOSIS — I10 Essential (primary) hypertension: Secondary | ICD-10-CM

## 2021-03-14 DIAGNOSIS — F32A Depression, unspecified: Secondary | ICD-10-CM

## 2021-03-14 DIAGNOSIS — J310 Chronic rhinitis: Secondary | ICD-10-CM | POA: Diagnosis not present

## 2021-03-14 NOTE — Patient Instructions (Addendum)
Check the  blood pressure   BP GOAL is between 110/65 and  135/85. If it is consistently higher or lower, let me know   Try FLONASE OTC 2 sprays on each side of the nose at night    GO TO THE FRONT DESK, [place] back for a physical exam in 3 months, fasting

## 2021-03-14 NOTE — Progress Notes (Signed)
Subjective:    Patient ID: Cassidy Shepard, female    DOB: [birth year], 50 y.o.   MRN: [id number]  DOS:  03/14/2021 Type of visit - description: F/U  Routine office visit Today we talked about hypertension, vaccines and other issues.  Reports a sporadic right upper quadrant abdominal discomfort, mild, not related with food, not associated with nausea or vomiting. Denies any dysuria or gross hematuria.  No LUTS.  Also episodic left dorsum of the foot swelling.  The area does not get red or painful.  Also, right nostril congestion mostly at night, this is going on for a while, saw ENT before, surgery was offered.  Review of Systems See above   Past Medical History:  Diagnosis Date  . Anxiety and depression, insomnia 10/23/2009  . Unspecified vitamin D deficiency 05/22/2014    Past Surgical History:  Procedure Laterality Date  . APPENDECTOMY    . CESAREAN SECTION     x 2  . CHOLECYSTECTOMY    . INTRAUTERINE DEVICE INSERTION  july 2016  . SKIN SURGERY  02/20/16    Allergies as of 03/14/2021      Reactions   Sulfonamide Derivatives    REACTION: rash      Medication List       Accurate as of Mar 14, 2021  9:32 PM. If you have any questions, ask your nurse or doctor.        STOP taking these medications   ALPRAZolam 0.5 MG tablet Commonly known as: XANAX Stopped by: Kathlene November, MD     TAKE these medications   amLODipine 5 MG tablet Commonly known as: NORVASC Take 1 tablet (5 mg total) by mouth daily.   cholecalciferol 25 MCG (1000 UNIT) tablet Commonly known as: VITAMIN D3 Take 1,000 Units by mouth daily.   metoprolol tartrate 25 MG tablet Commonly known as: LOPRESSOR Take 1 tablet (25 mg total) by mouth 2 (two) times daily.   paragard intrauterine copper Iud IUD 1 each by Intrauterine route once.          Objective:   Physical Exam BP 115/76 (BP Location: Left Arm, Patient Position: Sitting, Cuff Size: Large)   Pulse 68   Temp 97.9 F (36.6 C) (Temporal)    Ht 5\' 4"  (1.626 m)   Wt 195 lb (88.5 kg)   SpO2 100%   BMI 33.47 kg/m  General:   Well developed, NAD, BMI noted.  HEENT:  Normocephalic . Face symmetric, atraumatic. Nose: Turbinates slightly enlarged bilaterally. Lungs:  CTA B Normal respiratory effort, no intercostal retractions, no accessory muscle use. Heart: RRR,  no murmur.  Abdomen:  Not distended, soft, non-tender. No rebound or rigidity.   Skin: Not pale. Not jaundice Lower extremities: no pretibial edema bilaterally.  Calves symmetric, nontender Neurologic:  alert & oriented X3.  Speech normal, gait appropriate for age and unassisted Psych--  Cognition and judgment appear intact.  Cooperative with normal attention span and concentration.  Behavior appropriate. No anxious or depressed appearing.     Assessment       Assessment HTN Anxiety, depression, insomnia Vitamin D deficiency Atypical mole, chest 02-2016 Birth control -- IUD  PLAN: HTN: BP today is very good, continue amlodipine and metoprolol.  She reported episodic edema at the lower extremities but exam today is negative. Anxiety, depression, insomnia: His stress has significantly decreased, does not need any medication at this point RUQ abdominal pain: Episodic, as described above, had an appendectomy cholecystectomy.  Recommend observation. Rhinitis: Many  years history of nasal congestion, mostly at night, saw Dr. Wilburn Cornelia years ago, a CT was done (apparently in 2013), surgery was offered but never pursued.  Plan: Flonase nightly, if no better he may like to see ENT again. Preventive care: Recommend to see gyn, have a MMG A #4 COVID-vaccine is a consideration, she prefers to wait. She is due for labs but we agreed to simply wait 3 months, she will return for CPX and will do blood work then.   This visit occurred during the SARS-CoV-2 public health emergency.  Safety protocols were in place, including screening questions prior to the visit,  additional usage of staff PPE, and extensive cleaning of exam room while observing appropriate contact time as indicated for disinfecting solutions.

## 2021-03-14 NOTE — Assessment & Plan Note (Signed)
HTN: BP today is very good, continue amlodipine and metoprolol.  She reported episodic edema at the lower extremities but exam today is negative. Anxiety, depression, insomnia: His stress has significantly decreased, does not need any medication at this point RUQ abdominal pain: Episodic, as described above, had an appendectomy cholecystectomy.  Recommend observation. Rhinitis: Many years history of nasal congestion, mostly at night, saw Dr. Wilburn Cornelia years ago, a CT was done (apparently in 2013), surgery was offered but never pursued.  Plan: Flonase nightly, if no better he may like to see ENT again. Preventive care: Recommend to see gyn, have a MMG A #4 COVID-vaccine is a consideration, she prefers to wait. She is due for labs but we agreed to simply wait 3 months, she will return for CPX and will do blood work then.

## 2021-03-21 ENCOUNTER — Other Ambulatory Visit: Payer: Self-pay | Admitting: Internal Medicine

## 2021-03-21 MED ORDER — METOPROLOL TARTRATE 25 MG PO TABS: 25.0000 mg | ORAL_TABLET | Freq: Two times a day (BID) | ORAL | 1 refills | Status: DC

## 2021-06-19 ENCOUNTER — Ambulatory Visit (INDEPENDENT_AMBULATORY_CARE_PROVIDER_SITE_OTHER): Payer: 59 | Admitting: Internal Medicine

## 2021-06-19 ENCOUNTER — Other Ambulatory Visit: Payer: Self-pay

## 2021-06-19 ENCOUNTER — Encounter: Payer: Self-pay | Admitting: Internal Medicine

## 2021-06-19 VITALS — BP 116/80 | HR 67 | Temp 98.1°F | Resp 16 | Ht 64.0 in | Wt 194.1 lb

## 2021-06-19 DIAGNOSIS — I10 Essential (primary) hypertension: Secondary | ICD-10-CM | POA: Diagnosis not present

## 2021-06-19 DIAGNOSIS — E559 Vitamin D deficiency, unspecified: Secondary | ICD-10-CM

## 2021-06-19 DIAGNOSIS — Z1159 Encounter for screening for other viral diseases: Secondary | ICD-10-CM

## 2021-06-19 DIAGNOSIS — E538 Deficiency of other specified B group vitamins: Secondary | ICD-10-CM | POA: Diagnosis not present

## 2021-06-19 DIAGNOSIS — Z0000 Encounter for general adult medical examination without abnormal findings: Secondary | ICD-10-CM | POA: Diagnosis not present

## 2021-06-19 LAB — B12 AND FOLATE PANEL
Folate: 15 ng/mL (ref >5.9)
Vitamin B-12: 141 pg/mL — ABNORMAL LOW (ref 211–911)

## 2021-06-19 LAB — LIPID PANEL
Cholesterol: 162 mg/dL (ref 0–200)
HDL: 41.8 mg/dL (ref >39.00)
LDL Cholesterol: 105 mg/dL — ABNORMAL HIGH (ref 0–99)
NonHDL: 119.98
Total CHOL/HDL Ratio: 4
Triglycerides: 74 mg/dL (ref 0.0–149.0)
VLDL: 14.8 mg/dL (ref 0.0–40.0)

## 2021-06-19 LAB — COMPREHENSIVE METABOLIC PANEL
ALT: 12 U/L (ref 0–35)
AST: 13 U/L (ref 0–37)
Albumin: 3.9 g/dL (ref 3.5–5.2)
Alkaline Phosphatase: 57 U/L (ref 39–117)
BUN: 8 mg/dL (ref 6–23)
CO2: 24 mEq/L (ref 19–32)
Calcium: 9.5 mg/dL (ref 8.4–10.5)
Chloride: 104 mEq/L (ref 96–112)
Creatinine, Ser: 0.72 mg/dL (ref 0.40–1.20)
GFR: 98.02 mL/min (ref >60.00)
Glucose, Bld: 95 mg/dL (ref 70–99)
Potassium: 4.5 mEq/L (ref 3.5–5.1)
Sodium: 135 mEq/L (ref 135–145)
Total Bilirubin: 0.6 mg/dL (ref 0.2–1.2)
Total Protein: 7.7 g/dL (ref 6.0–8.3)

## 2021-06-19 LAB — CBC WITH DIFFERENTIAL/PLATELET
Basophils Absolute: 0.1 10*3/uL (ref 0.0–0.1)
Basophils Relative: 1 % (ref 0.0–3.0)
Eosinophils Absolute: 0.3 10*3/uL (ref 0.0–0.7)
Eosinophils Relative: 2.8 % (ref 0.0–5.0)
HCT: 38.1 % (ref 36.0–46.0)
Hemoglobin: 12.3 g/dL (ref 12.0–15.0)
Lymphocytes Relative: 27.8 % (ref 12.0–46.0)
Lymphs Abs: 2.9 10*3/uL (ref 0.7–4.0)
MCHC: 32.3 g/dL (ref 30.0–36.0)
MCV: 89.5 fl (ref 78.0–100.0)
Monocytes Absolute: 1.1 10*3/uL — ABNORMAL HIGH (ref 0.1–1.0)
Monocytes Relative: 10.5 % (ref 3.0–12.0)
Neutro Abs: 6.1 10*3/uL (ref 1.4–7.7)
Neutrophils Relative %: 57.9 % (ref 43.0–77.0)
Platelets: 321 10*3/uL (ref 150.0–400.0)
RBC: 4.26 Mil/uL (ref 3.87–5.11)
RDW: 13.7 % (ref 11.5–15.5)
WBC: 10.6 10*3/uL — ABNORMAL HIGH (ref 4.0–10.5)

## 2021-06-19 LAB — TSH: TSH: 1.52 u[IU]/mL (ref 0.35–5.50)

## 2021-06-19 LAB — VITAMIN D 25 HYDROXY (VIT D DEFICIENCY, FRACTURES): VITD: 21.3 ng/mL — ABNORMAL LOW (ref 30.00–100.00)

## 2021-06-19 LAB — HEMOGLOBIN A1C: Hgb A1c MFr Bld: 6 % (ref 4.6–6.5)

## 2021-06-19 NOTE — Patient Instructions (Addendum)
Get a flu shot this year   GO TO THE LAB : Get the blood work     [place], [place] back for a physical exam in 1 year  Check the  blood pressure 2   times a month   BP GOAL is between 110/65 and  135/85. If it is consistently higher or lower, let me know

## 2021-06-19 NOTE — Assessment & Plan Note (Signed)
Here for CPX HTN: BP is great, continue metoprolol, amlodipine, recommend to check BPs from time to time. Vitamin D and B12 deficiencies: Takes a multivitamin a day, checking labs. RTC 1 year

## 2021-06-19 NOTE — Progress Notes (Signed)
Subjective:    Patient ID: Cassidy Shepard, female    DOB: November 16, 1970, 50 y.o.   MRN: [id number]  DOS:  06/19/2021 Type of visit - description: CPX Since the last office visit is feeling well and has no major concerns.  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 [employer]  Tobacco Use   Smoking status: Former   Smokeless tobacco: Never   Tobacco comments:    smoke a little as a teenager   Substance and Sexual Activity   Alcohol use: Yes    Alcohol/week: 0.0 standard drinks    Comment: socially    Drug use: No   Sexual activity: Not on file  Other Topics Concern   Not on file  Social History Narrative   Married, separated , moved to her own place ~ 11-2014   2 daughters 2001, 2005, shared custody   Original from [place]       Social Determinants of Health   Financial Resource Strain: Not on [employer] Insecurity: Not on file  Transportation Needs: Not on file  Physical Activity: Not on file  Stress: Not on file  Social Connections: Not on file  Intimate Partner Violence: Not on file    Allergies as of 06/19/2021       Reactions   Sulfonamide Derivatives    REACTION: rash        Medication List        Accurate as of June 19, 2021  2:25 PM. If you have any questions, ask your nurse or doctor.          amLODipine 5 MG tablet Commonly  known as: NORVASC Take 1 tablet (5 mg total) by mouth daily.   cholecalciferol 25 MCG (1000 UNIT) tablet Commonly known as: VITAMIN D3 Take 1,000 Units by mouth daily.   metoprolol tartrate 25 MG tablet Commonly known as: LOPRESSOR Take 1 tablet (25 mg total) by mouth 2 (two) times daily.   paragard intrauterine copper Iud IUD 1 each by Intrauterine route once.          Objective:   Physical Exam BP 116/80 (BP Location: Left Arm, Patient Position: Sitting, Cuff Size: Normal)   Pulse 67   Temp 98.1 F (36.7 C) (Oral)   Resp 16   Ht '5\' 4"'$  (1.626 m)   Wt 194 lb 2 oz (88.1 kg)   SpO2 97%   BMI 33.32 kg/m  General: Well developed, NAD, BMI noted Neck: No  thyromegaly  HEENT:  Normocephalic . Face symmetric, atraumatic Lungs:  CTA B Normal respiratory effort, no intercostal retractions, no accessory muscle use. Heart: RRR,  no murmur.  Abdomen:  Not distended, soft, non-tender. No rebound or rigidity.  Lower extremities: no pretibial edema bilaterally  Skin: Exposed areas without rash. Not pale. Not jaundice Neurologic:  alert & oriented X3.  Speech normal, gait appropriate for age and unassisted Strength symmetric and appropriate for age.  Psych: Cognition and judgment appear intact.  Cooperative with normal attention span and concentration.  Behavior appropriate. No anxious or depressed appearing.     Assessment    Assessment HTN Anxiety, depression, insomnia Vitamin D deficiency B12 deficiency Atypical mole, chest 02-2016 Birth control -- IUD  PLAN: Here for CPX HTN: BP is great, continue metoprolol, amlodipine, recommend to check BPs from time to time. Vitamin D and B12 deficiencies: Takes a multivitamin a day, checking labs. RTC 1 year  This visit occurred during the SARS-CoV-2 public health emergency.  Safety protocols were in place, including screening questions prior to the visit, additional usage of staff PPE, and extensive cleaning of exam  room while observing appropriate contact time as indicated for disinfecting solutions.

## 2021-06-19 NOTE — Assessment & Plan Note (Signed)
--  Td 09-2013 per pt @ gyn office  - covid vax x 3 - flu shot typically done at her job --  female care: to see gyn next month; had a MMG 06-2020 ([facility]) -- CCS: No FH, never had a colonoscopy, 3 options discussed, she took I fob and will return it although she may proceed with a Cologuard. --Labs: CMP, FLP, A1c, CBC, TSH, hep C, Ifob, vitamin D, B12 --Diet and exercise: Discussed.  Recommend 3 hours of exercise weekly, with talk about a healthy lifestyle.

## 2021-06-21 MED ORDER — VITAMIN D (ERGOCALCIFEROL) 1.25 MG (50000 UNIT) PO CAPS: 50000.0000 [IU] | ORAL_CAPSULE | ORAL | 0 refills | Status: DC

## 2021-06-21 NOTE — Addendum Note (Signed)
Addended byDamita Dunnings D on: 06/21/2021 07:44 AM   Modules accepted: Orders

## 2021-08-05 ENCOUNTER — Telehealth: Payer: Self-pay

## 2021-08-05 NOTE — Telephone Encounter (Signed)
Patient Name: Cassidy Shepard Gender: Female DOB: [birth year] Age: 50 Y 9 M 9 D Return Phone [employer]) Corporate investment banker [facility] - Client Client Site [facility] - Night Physician Kathlene November - MD Contact Type Call Who Is Calling Patient / Member / Family / Caregiver Call Type Triage / Clinical Relationship To Patient Self Return Phone Number [phone] (Primary) Chief Complaint Cough Reason for Call Symptomatic / Request for [place] states she just tested positive for COVID. Symptoms of scratchy throat, fever, body aches, congestion. A little bit of dry cough. Translation No Nurse Assessment Nurse: Windle Guard, RN, Olin Hauser Date/Time (Eastern Time): 08/03/2021 7:12:22 AM Confirm and document reason for call. If symptomatic, describe symptoms. ---Caller states she is positive for COVID (symptoms started on Sat).Has scratchy throat, body aches, nasal congestion, mild dry cough, loss of smell. Was seen yesterday and prescribed steroid and cough medicine ( has not started yet ). No fever currently. Does the patient have any new or worsening symptoms? ---Yes Will a triage be completed? ---Yes Related visit to physician within the last 2 weeks? ---Yes Does the PT have any chronic conditions? (i.e. diabetes, asthma, this includes High risk factors for pregnancy, etc.) ---Yes List chronic conditions. ---HTN Is the patient pregnant or possibly pregnant? (Ask all females between the ages of 43-55) ---No Is this a behavioral health or substance abuse call? ---No Guidelines Guideline Title Affirmed Question Affirmed Notes Nurse Date/Time (Eastern Time) COVID-19 - Diagnosed or Suspected [1] COVID-19 diagnosed by positive lab test (e.g., PCR, rapid self-test kit) AND [2] mild symptoms Windle Guard, RN, Olin Hauser 08/03/2021 7:16:43 AM PLEASE NOTE: All timestamps contained within this report are represented as  [place] Standard Time. CONFIDENTIALTY NOTICE: This fax transmission is intended only for the addressee. It contains information that is legally privileged, confidential or otherwise protected from use or disclosure. If you are not the intended recipient, you are strictly prohibited from reviewing, disclosing, copying using or disseminating any of this information or taking any action in reliance on or regarding this information. If you have received this fax in error, please notify us immediately by telephone so that we can arrange for its return to [place]. Phone: [phone], Toll-Free: [phone], Fax: [phone] Page: 2 of 2 Call Id: [id number] Guidelines Guideline Title Affirmed Question Affirmed Notes Nurse Date/Time Eilene Ghazi Time) (e.g., cough, fever, others) AND [2] no complications or SOB Disp. Time Eilene Ghazi Time) Disposition Final User 08/03/2021 1:25:54 AM Attempt made - message left Brigitte Pulse RN, Jinny Blossom 08/03/2021 1:53:20 AM FINAL ATTEMPT MADE - message left Rema Fendt 08/03/2021 1:53:31 AM Send to RN Final Attempt Bing Neighbors, RN, Megan 08/03/2021 7:20:46 AM Home Care Yes Windle Guard, RN, Otho Najjar Disagree/Comply Comply Caller Understands Yes PreDisposition Did not know what to do Care Advice Given Per Guideline HOME CARE: * You should be able to treat this at home. GENERAL CARE ADVICE FOR COVID-19 SYMPTOMS: * The symptoms are generally treated the same whether you have COVID-19, influenza or some other respiratory virus. * Cough: Use cough drops. * Feeling dehydrated: Drink extra liquids. If the air in your home is dry, use a humidifier. * Fever: For fever over 101 F (38.3 C), take acetaminophen every 4 to 6 hours (Adults 650 mg) OR ibuprofen every 6 to 8 hours (Adults 400 mg). Before taking any medicine, read all the instructions on the package. Do not take aspirin unless your doctor has prescribed it for you. COUGH MEDICINES: * COUGH DROPS: Over-the-counter  cough drops can  help a lot, especially for mild coughs. They soothe an irritated throat and remove the tickle sensation in the back of the throat. Cough drops are easy to carry with you. PAIN AND FEVER MEDICINES: * For pain or fever relief, take either acetaminophen or ibuprofen. COVID-19 - HOW TO PROTECT OTHERS - WHEN YOU ARE SICK WITH COVID-19: * STAY HOME A MINIMUM OF 5 DAYS: People with MILD COVID-19 can STOP HOME ISOLATION AFTER 5 DAYS if (1) fever has been gone for 24 hours (without using fever medicine) AND (2) symptoms are better. Continue to wear a well-fitted mask for a full 10 days when around others. * WEAR A MASK FOR 10 DAYS: Wear a well-fitted mask for 10 full days any time you are around others inside your home or in public. Do not go to places where you are unable to wear a mask. CALL BACK IF: * Chest pain or difficulty breathing occurs * You become worse CARE ADVICE given per COVID-19 - DIAGNOSED OR SUSPECTED (Adult) guideline

## 2021-08-05 NOTE — Telephone Encounter (Signed)
Called pt to offer appointment but she stated she no longer needed it. Patient did not give more information.

## 2021-10-09 ENCOUNTER — Encounter: Payer: Self-pay | Admitting: Internal Medicine

## 2021-10-09 ENCOUNTER — Other Ambulatory Visit: Payer: Self-pay | Admitting: Internal Medicine

## 2021-10-09 MED ORDER — ALPRAZOLAM 0.5 MG PO TABS: 0.5000 mg | ORAL_TABLET | Freq: Every evening | ORAL | 0 refills | Status: DC | PRN

## 2021-10-10 NOTE — Telephone Encounter (Signed)
done

## 2021-10-13 ENCOUNTER — Other Ambulatory Visit: Payer: Self-pay | Admitting: Internal Medicine

## 2021-12-01 ENCOUNTER — Telehealth: Payer: Self-pay | Admitting: Internal Medicine

## 2021-12-02 NOTE — Telephone Encounter (Signed)
Requesting: alprazolam 0.5mg   Contract: 03/08/2018 UDS: 03/08/2018 Last Visit: 06/19/2021 Next Visit:06/20/2022 Last Refill: 10/09/2021 #30 and 7ML   Will need new contract and UDS at next visit  Please Advise

## 2021-12-02 NOTE — Telephone Encounter (Signed)
PDMP okay, Rx sent 

## 2021-12-03 ENCOUNTER — Encounter: Payer: Self-pay | Admitting: Internal Medicine

## 2022-02-04 ENCOUNTER — Other Ambulatory Visit: Payer: Self-pay | Admitting: Internal Medicine

## 2022-03-18 ENCOUNTER — Telehealth: Payer: Self-pay | Admitting: Internal Medicine

## 2022-03-18 NOTE — Telephone Encounter (Signed)
Requesting:xanax 0.5 mg Contract:unknown [id number] Last Visit:06/19/21 Next Visit:07/07/22 Last Refill:12/02/21  Please Advise

## 2022-03-18 NOTE — Telephone Encounter (Signed)
PDMP okay, Rx sent 

## 2022-06-05 ENCOUNTER — Encounter: Payer: Self-pay | Admitting: Internal Medicine

## 2022-06-11 ENCOUNTER — Other Ambulatory Visit: Payer: Self-pay | Admitting: Internal Medicine

## 2022-06-11 NOTE — Telephone Encounter (Signed)
Requesting: alprazolam 0.'5mg'$   Contract: 03/08/2018 UDS: 03/08/2018 Last Visit: 06/19/21 Next Visit: 07/07/2022 Last Refill: 03/18/22 #30 and 1RF  Please Advise

## 2022-06-20 ENCOUNTER — Encounter: Payer: 59 | Admitting: Internal Medicine

## 2022-07-07 ENCOUNTER — Ambulatory Visit (INDEPENDENT_AMBULATORY_CARE_PROVIDER_SITE_OTHER): Payer: 59 | Admitting: Internal Medicine

## 2022-07-07 ENCOUNTER — Encounter: Payer: Self-pay | Admitting: Internal Medicine

## 2022-07-07 VITALS — BP 126/82 | HR 68 | Temp 98.4°F | Resp 16 | Ht 64.0 in | Wt 189.5 lb

## 2022-07-07 DIAGNOSIS — I10 Essential (primary) hypertension: Secondary | ICD-10-CM

## 2022-07-07 DIAGNOSIS — Z79899 Other long term (current) drug therapy: Secondary | ICD-10-CM

## 2022-07-07 DIAGNOSIS — F32A Depression, unspecified: Secondary | ICD-10-CM

## 2022-07-07 DIAGNOSIS — E559 Vitamin D deficiency, unspecified: Secondary | ICD-10-CM | POA: Diagnosis not present

## 2022-07-07 DIAGNOSIS — Z1159 Encounter for screening for other viral diseases: Secondary | ICD-10-CM

## 2022-07-07 DIAGNOSIS — F419 Anxiety disorder, unspecified: Secondary | ICD-10-CM | POA: Diagnosis not present

## 2022-07-07 DIAGNOSIS — R739 Hyperglycemia, unspecified: Secondary | ICD-10-CM | POA: Diagnosis not present

## 2022-07-07 DIAGNOSIS — Z0000 Encounter for general adult medical examination without abnormal findings: Secondary | ICD-10-CM

## 2022-07-07 DIAGNOSIS — E538 Deficiency of other specified B group vitamins: Secondary | ICD-10-CM

## 2022-07-07 DIAGNOSIS — Z1211 Encounter for screening for malignant neoplasm of colon: Secondary | ICD-10-CM

## 2022-07-07 NOTE — Patient Instructions (Addendum)
We are referring you to the gastroenterology for a colonoscopy You can call them at: [phone]  Increase vitamin D3 over-the-counter to 2000 units daily Take a B12 supplement over-the-counter once daily  GO TO THE LAB : Get the blood work     [place], [place] back for a physical exam in 1 year

## 2022-07-07 NOTE — Progress Notes (Unsigned)
Subjective:    Patient ID: Cassidy Shepard, female    DOB: [birth year], 51 y.o.   MRN: [id number]  DOS:  07/07/2022 Type of visit - description: CPX  For CPX. Feeling well.  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  Tobacco Use   Smoking status: Former   Smokeless tobacco: Never   Tobacco comments:    smoke a little as a teenager   Substance and Sexual Activity   Alcohol use: Yes    Alcohol/week: 0.0 standard drinks of alcohol    Comment: socially    Drug use: No   Sexual activity: Not on file  Other Topics Concern   Not on file  Social History Narrative   Married, separated , moved to her own place ~ 11-2014   2 daughters 2001, 2005, shared custody   Original from [place]       Social Determinants of Radio broadcast assistant Strain: Not on [employer] Insecurity: Not on file  Transportation Needs: Not on file  Physical Activity: Not on file  Stress: Not on file  Social Connections: Not on file  Intimate Partner Violence: Not on file    Current Outpatient Medications  Medication Instructions   ALPRAZolam (XANAX) 0.5 mg, Oral, At bedtime PRN   amLODipine (NORVASC) 5 MG tablet Oral, Daily   metoprolol tartrate (LOPRESSOR) 25 MG tablet TAKE 1 TABLET BY MOUTH TWICE A DAY   PARAGARD INTRAUTERINE COPPER IUD IUD 1 each, Intrauterine,  Once    Vitamin D 2,000 Units, Oral, Daily       Objective:   Physical Exam BP 126/82   Pulse 68   Temp 98.4 F (36.9 C) (Oral)   Resp 16   Ht '5\' 4"'$  (1.626 m)   Wt 189 lb 8 oz (86 kg)   SpO2 98%   BMI 32.53 kg/m  General: Well developed, NAD, BMI noted Neck: No  thyromegaly  HEENT:  Normocephalic . Face symmetric, atraumatic Lungs:  CTA B Normal respiratory effort, no intercostal retractions, no accessory muscle use. Heart: RRR,  no murmur.  Abdomen:  Not distended, soft, non-tender. No rebound or rigidity.   Lower extremities: no pretibial edema bilaterally  Skin: Exposed areas without rash. Not pale. Not jaundice Neurologic:  alert & oriented X3.  Speech normal, gait appropriate for age and unassisted Strength symmetric and appropriate for age.  Psych: Cognition and judgment appear intact.  Cooperative with normal attention span and concentration.  Behavior appropriate. No anxious or depressed appearing.     Assessment    Assessment HTN Anxiety, depression, insomnia Vitamin D deficiency B12 deficiency Atypical mole, chest 02-2016 Birth control -- IUD  PLAN: Here for CPX HTN: BP is great  today, continue amlodipine, metoprolol.  Check labs Anxiety, depression, insomnia: Only on Xanax, UDS and contract today Vitamin D and B12 deficiencies: Had ergocalciferol, on OTC vitamin D at 1000 units, recommend to increase to 2000 units.  Also taking B12 for sporadically, recommend to take daily. Checking labs. RTC 1 year

## 2022-07-08 ENCOUNTER — Encounter: Payer: Self-pay | Admitting: Internal Medicine

## 2022-07-08 LAB — CBC WITH DIFFERENTIAL/PLATELET
Basophils Absolute: 0.1 10*3/uL (ref 0.0–0.1)
Basophils Relative: 0.9 % (ref 0.0–3.0)
Eosinophils Absolute: 0.5 10*3/uL (ref 0.0–0.7)
Eosinophils Relative: 4.9 % (ref 0.0–5.0)
HCT: 37.6 % (ref 36.0–46.0)
Hemoglobin: 12.5 g/dL (ref 12.0–15.0)
Lymphocytes Relative: 36.2 % (ref 12.0–46.0)
Lymphs Abs: 3.5 10*3/uL (ref 0.7–4.0)
MCHC: 33.2 g/dL (ref 30.0–36.0)
MCV: 89 fl (ref 78.0–100.0)
Monocytes Absolute: 1 10*3/uL (ref 0.1–1.0)
Monocytes Relative: 10 % (ref 3.0–12.0)
Neutro Abs: 4.7 10*3/uL (ref 1.4–7.7)
Neutrophils Relative %: 48 % (ref 43.0–77.0)
Platelets: 268 10*3/uL (ref 150.0–400.0)
RBC: 4.22 Mil/uL (ref 3.87–5.11)
RDW: 13.4 % (ref 11.5–15.5)
WBC: 9.7 10*3/uL (ref 4.0–10.5)

## 2022-07-08 LAB — COMPREHENSIVE METABOLIC PANEL
ALT: 13 U/L (ref 0–35)
AST: 15 U/L (ref 0–37)
Albumin: 4.1 g/dL (ref 3.5–5.2)
Alkaline Phosphatase: 59 U/L (ref 39–117)
BUN: 9 mg/dL (ref 6–23)
CO2: 26 mEq/L (ref 19–32)
Calcium: 9.3 mg/dL (ref 8.4–10.5)
Chloride: 102 mEq/L (ref 96–112)
Creatinine, Ser: 0.63 mg/dL (ref 0.40–1.20)
GFR: 103.23 mL/min (ref >60.00)
Glucose, Bld: 75 mg/dL (ref 70–99)
Potassium: 4.1 mEq/L (ref 3.5–5.1)
Sodium: 137 mEq/L (ref 135–145)
Total Bilirubin: 0.4 mg/dL (ref 0.2–1.2)
Total Protein: 8.1 g/dL (ref 6.0–8.3)

## 2022-07-08 LAB — LIPID PANEL
Cholesterol: 176 mg/dL (ref 0–200)
HDL: 47.3 mg/dL (ref >39.00)
LDL Cholesterol: 112 mg/dL — ABNORMAL HIGH (ref 0–99)
NonHDL: 128.99
Total CHOL/HDL Ratio: 4
Triglycerides: 87 mg/dL (ref 0.0–149.0)
VLDL: 17.4 mg/dL (ref 0.0–40.0)

## 2022-07-08 LAB — B12 AND FOLATE PANEL
Folate: 12.7 ng/mL (ref >5.9)
Vitamin B-12: 239 pg/mL (ref 211–911)

## 2022-07-08 LAB — VITAMIN D 25 HYDROXY (VIT D DEFICIENCY, FRACTURES): VITD: 24.21 ng/mL — ABNORMAL LOW (ref 30.00–100.00)

## 2022-07-08 LAB — HEMOGLOBIN A1C: Hgb A1c MFr Bld: 6.1 % (ref 4.6–6.5)

## 2022-07-08 NOTE — Assessment & Plan Note (Signed)
Here for CPX HTN: BP is great today, continue amlodipine, metoprolol.  Check labs Anxiety, depression, insomnia: Only on Xanax, UDS and contract today Vitamin D and B12 deficiencies: Had ergocalciferol, on OTC vitamin D at 1000 units, recommend to increase to 2000 units.  Also taking B12 for sporadically, recommend to take daily. Checking labs. RTC 1 year

## 2022-07-08 NOTE — Assessment & Plan Note (Signed)
--  Td 09-2013 per pt @ gyn office  - s/p shingrix x 1 per pt, plans to proceed w/ #2 - covid vax: Recommended - flu shot: Recommended --  female care: Rec to see gynecology, had a MMG 06-2021 ([facility]) -- CCS: No FH, never had a colonoscopy, 3 options discussed, GI referral   --Labs:  CMP, FLP, CBC, A1c, B12, vitamin D, hep C --Diet and exercise: Long conversation about the need for portion control, increase physical activity.  She wonders about injections for weight loss, she qualifies, schedule office visit if desires to pursue treatment

## 2022-07-09 LAB — DRUG MONITORING PANEL 375977 , URINE
Alcohol Metabolites: NEGATIVE ng/mL
Alphahydroxyalprazolam: 64 ng/mL — ABNORMAL HIGH
Alphahydroxymidazolam: NEGATIVE ng/mL
Alphahydroxytriazolam: NEGATIVE ng/mL
Aminoclonazepam: NEGATIVE ng/mL
Amphetamines: NEGATIVE ng/mL
Barbiturates: NEGATIVE ng/mL
Benzodiazepines: POSITIVE ng/mL — AB
Cocaine Metabolite: NEGATIVE ng/mL
Desmethyltramadol: NEGATIVE ng/mL
Hydroxyethylflurazepam: NEGATIVE ng/mL
Lorazepam: NEGATIVE ng/mL
Marijuana Metabolite: NEGATIVE ng/mL
Nordiazepam: NEGATIVE ng/mL
Opiates: NEGATIVE ng/mL
Oxazepam: NEGATIVE ng/mL
Oxycodone: NEGATIVE ng/mL
Temazepam: NEGATIVE ng/mL
Tramadol: NEGATIVE ng/mL

## 2022-07-09 LAB — DM TEMPLATE

## 2022-07-11 ENCOUNTER — Encounter: Payer: Self-pay | Admitting: Internal Medicine

## 2022-07-14 MED ORDER — VITAMIN D (ERGOCALCIFEROL) 1.25 MG (50000 UNIT) PO CAPS: 50000.0000 [IU] | ORAL_CAPSULE | ORAL | 0 refills | Status: DC

## 2022-07-14 NOTE — Addendum Note (Signed)
Addended byDamita Dunnings D on: 07/14/2022 08:18 AM   Modules accepted: Orders

## 2022-07-21 LAB — HM MAMMOGRAPHY

## 2022-07-22 ENCOUNTER — Encounter: Payer: Self-pay | Admitting: Internal Medicine

## 2022-07-23 ENCOUNTER — Telehealth: Payer: Self-pay | Admitting: Family Medicine

## 2022-07-24 NOTE — Telephone Encounter (Signed)
PDMP reviewed, RF sent

## 2022-07-24 NOTE — Telephone Encounter (Signed)
Requesting: xanax Contract: 07/07/2022 UDS: 07/07/2022 Last Visit:07/07/2022 Next Visit:07/09/2023 Last Refill:06/11/2022  Please Advise

## 2022-09-06 ENCOUNTER — Other Ambulatory Visit: Payer: Self-pay | Admitting: Internal Medicine

## 2022-12-09 ENCOUNTER — Other Ambulatory Visit: Payer: Self-pay | Admitting: Internal Medicine

## 2023-01-28 ENCOUNTER — Encounter: Payer: Self-pay | Admitting: Internal Medicine

## 2023-01-28 ENCOUNTER — Other Ambulatory Visit: Payer: Self-pay

## 2023-01-28 DIAGNOSIS — Z1211 Encounter for screening for malignant neoplasm of colon: Secondary | ICD-10-CM

## 2023-03-11 ENCOUNTER — Telehealth: Payer: Self-pay | Admitting: Internal Medicine

## 2023-03-12 NOTE — Telephone Encounter (Signed)
Requesting: alprazolam 0.5mg   Contract: 07/07/22 UDS: 07/07/22 Last Visit: 07/07/22 Next Visit: None Last Refill: 07/24/22 #30 and 3RF  Please Advise

## 2023-03-12 NOTE — Telephone Encounter (Signed)
Pdmp ok, rx sent  ? ?

## 2023-05-25 ENCOUNTER — Encounter: Payer: Self-pay | Admitting: Internal Medicine

## 2023-05-25 ENCOUNTER — Ambulatory Visit: Payer: 59 | Admitting: Internal Medicine

## 2023-05-25 VITALS — BP 128/86 | HR 85 | Temp 98.1°F | Resp 12 | Ht 64.0 in | Wt 203.8 lb

## 2023-05-25 DIAGNOSIS — M549 Dorsalgia, unspecified: Secondary | ICD-10-CM

## 2023-05-25 DIAGNOSIS — F419 Anxiety disorder, unspecified: Secondary | ICD-10-CM

## 2023-05-25 DIAGNOSIS — E669 Obesity, unspecified: Secondary | ICD-10-CM

## 2023-05-25 DIAGNOSIS — E66811 Obesity, class 1: Secondary | ICD-10-CM

## 2023-05-25 DIAGNOSIS — Z79899 Other long term (current) drug therapy: Secondary | ICD-10-CM

## 2023-05-25 MED ORDER — ZEPBOUND 2.5 MG/0.5ML ~~LOC~~ SOAJ: 2.5000 mg | SUBCUTANEOUS | 0 refills | Status: DC

## 2023-05-25 NOTE — Patient Instructions (Addendum)
Start is about 2.5 mg 1 injection weekly  We are referring you for physical therapy  See you next month for your physical

## 2023-05-25 NOTE — Progress Notes (Unsigned)
   Subjective:    Patient ID: Cassidy Shepard, female    DOB: 04-13-1971, 52 y.o.   MRN: [id number]  DOS:  05/25/2023 Type of visit - description: Follow-up  Here for weight management.  Also, for few months is having pain at the left interscapular area. No neck pain per se, no paresthesias.   Wt Readings from Last 3 Encounters:  05/25/23 203 lb 12.8 oz (92.4 kg)  07/07/22 189 lb 8 oz (86 kg)  06/19/21 194 lb 2 oz (88.1 kg)  2021: 188 pounds 2019: 200 pounds 2018: 193 pounds   Review of Systems See above   Past Medical History:  Diagnosis Date   Anxiety and depression, insomnia 10/23/2009   Unspecified vitamin D deficiency 05/22/2014    Past Surgical History:  Procedure Laterality Date   APPENDECTOMY     CESAREAN SECTION     x 2   CHOLECYSTECTOMY     INTRAUTERINE DEVICE INSERTION  july 2016   SKIN SURGERY  02/20/16    Current Outpatient Medications  Medication Instructions   ALPRAZolam (XANAX) 0.5 mg, Oral, At bedtime PRN   Cholecalciferol (VITAMIN D) 50 MCG (2000 UT) CAPS 1 capsule, Oral, Daily   metoprolol tartrate (LOPRESSOR) 25 mg, Oral, 2 times daily   PARAGARD INTRAUTERINE COPPER IUD IUD 1 each, Intrauterine,  Once       Objective:   Physical Exam BP 128/86 (BP Location: Left Arm, Cuff Size: Large)   Pulse 85   Temp 98.1 F (36.7 C) (Oral)   Resp 12   Ht 5\' 4"  (1.626 m)   Wt 203 lb 12.8 oz (92.4 kg)   SpO2 97%   BMI 34.98 kg/m  General:   Well developed, NAD, BMI noted. HEENT:  Normocephalic . Face symmetric, atraumatic Shoulders: Symmetric, ROM essentially normal. MSK: No TTP at the intrascapular areas Lower extremities: no pretibial edema bilaterally  Skin: Not pale. Not jaundice Neurologic:  alert & oriented X3.  Speech normal, gait appropriate for age and unassisted Psych--  Cognition and judgment appear intact.  Cooperative with normal attention span and concentration.  Behavior appropriate. No anxious or depressed appearing.      Assessment     Assessment HTN Anxiety, depression, insomnia Vitamin D deficiency B12 deficiency Atypical mole, chest 02-2016 Birth control -- IUD  PLAN: Obesity: The patient is 52 years old, when she finished high school she was 150 pounds.  Since then she steadily and slowly gain weight.  Has tried keto diet, intermittent fasting and other modalities which help with weight loss temporarily.  Zepbound? We had a long conversation about diet, 3 hours weekly of exercise. Also injectable meds-  side effect . At the end we agreed to restart Zepbound 2.5 mg weekly.  She will let me know in 3 weeks how that is working. She is aware of cost and possible problems with availability. Upper back pain: Recommend PT. RTC scheduled for next month CPX

## 2023-05-26 NOTE — Assessment & Plan Note (Signed)
Obesity: The patient is 52 years old, when she finished high school she was 150 pounds.  Since then she steadily and slowly gain weight.  Has tried keto diet, intermittent fasting and other modalities which help with weight loss temporarily.  Zepbound? We had a long conversation about diet, 3 hours weekly of exercise. Also injectable meds-  side effect . At the end we agreed to restart Zepbound 2.5 mg weekly.  She will let me know in 3 weeks how that is working. She is aware of cost and possible problems with availability. Upper back pain: Recommend PT. RTC scheduled for next month CPX

## 2023-06-02 ENCOUNTER — Telehealth: Payer: Self-pay

## 2023-06-02 ENCOUNTER — Other Ambulatory Visit (HOSPITAL_COMMUNITY): Payer: Self-pay

## 2023-06-02 NOTE — Telephone Encounter (Signed)
*  Primary  Pharmacy Patient Advocate Encounter   Received notification from CoverMyMeds that prior authorization for Zepbound 2.5MG /0.5ML pen-injectors  is required/requested.   Insurance verification completed.   The patient is insured through [facility] .   Per test claim: PA required; PA submitted to [facility] via CoverMyMeds Key/confirmation #/EOC [id number] Status is pending

## 2023-06-03 NOTE — Telephone Encounter (Signed)
Received fax from [facility] : Cassidy Shepard has been approved for zepbound Inj 2.5 mg reference # [id number] and approval good through 12/03/2023 fax scanned to chart

## 2023-06-04 ENCOUNTER — Encounter (INDEPENDENT_AMBULATORY_CARE_PROVIDER_SITE_OTHER): Payer: Self-pay

## 2023-06-10 ENCOUNTER — Encounter: Payer: Self-pay | Admitting: Internal Medicine

## 2023-07-01 ENCOUNTER — Other Ambulatory Visit: Payer: Self-pay | Admitting: Internal Medicine

## 2023-07-01 DIAGNOSIS — K219 Gastro-esophageal reflux disease without esophagitis: Secondary | ICD-10-CM | POA: Insufficient documentation

## 2023-07-01 MED ORDER — ZEPBOUND 5 MG/0.5ML ~~LOC~~ SOAJ: 5.0000 mg | SUBCUTANEOUS | 0 refills | Status: DC

## 2023-07-07 ENCOUNTER — Encounter: Payer: Self-pay | Admitting: Internal Medicine

## 2023-07-09 ENCOUNTER — Encounter: Payer: 59 | Admitting: Internal Medicine

## 2023-07-10 ENCOUNTER — Ambulatory Visit (INDEPENDENT_AMBULATORY_CARE_PROVIDER_SITE_OTHER): Payer: 59 | Admitting: Internal Medicine

## 2023-07-10 ENCOUNTER — Encounter: Payer: Self-pay | Admitting: Internal Medicine

## 2023-07-10 VITALS — BP 122/68 | HR 59 | Temp 98.1°F | Resp 16 | Ht 64.0 in | Wt 197.1 lb

## 2023-07-10 DIAGNOSIS — E538 Deficiency of other specified B group vitamins: Secondary | ICD-10-CM

## 2023-07-10 DIAGNOSIS — Z0000 Encounter for general adult medical examination without abnormal findings: Secondary | ICD-10-CM | POA: Diagnosis not present

## 2023-07-10 DIAGNOSIS — I10 Essential (primary) hypertension: Secondary | ICD-10-CM | POA: Diagnosis not present

## 2023-07-10 DIAGNOSIS — E669 Obesity, unspecified: Secondary | ICD-10-CM

## 2023-07-10 DIAGNOSIS — E559 Vitamin D deficiency, unspecified: Secondary | ICD-10-CM

## 2023-07-10 DIAGNOSIS — Z23 Encounter for immunization: Secondary | ICD-10-CM | POA: Diagnosis not present

## 2023-07-10 DIAGNOSIS — R739 Hyperglycemia, unspecified: Secondary | ICD-10-CM | POA: Diagnosis not present

## 2023-07-10 DIAGNOSIS — E66811 Obesity, class 1: Secondary | ICD-10-CM

## 2023-07-10 DIAGNOSIS — E611 Iron deficiency: Secondary | ICD-10-CM

## 2023-07-10 LAB — COMPREHENSIVE METABOLIC PANEL
ALT: 12 U/L (ref 0–35)
AST: 12 U/L (ref 0–37)
Albumin: 4 g/dL (ref 3.5–5.2)
Alkaline Phosphatase: 57 U/L (ref 39–117)
BUN: 10 mg/dL (ref 6–23)
CO2: 27 mEq/L (ref 19–32)
Calcium: 9.4 mg/dL (ref 8.4–10.5)
Chloride: 103 mEq/L (ref 96–112)
Creatinine, Ser: 0.75 mg/dL (ref 0.40–1.20)
GFR: 92 mL/min (ref >60.00)
Glucose, Bld: 91 mg/dL (ref 70–99)
Potassium: 4.5 mEq/L (ref 3.5–5.1)
Sodium: 136 mEq/L (ref 135–145)
Total Bilirubin: 0.5 mg/dL (ref 0.2–1.2)
Total Protein: 7.5 g/dL (ref 6.0–8.3)

## 2023-07-10 LAB — CBC WITH DIFFERENTIAL/PLATELET
Basophils Absolute: 0 10*3/uL (ref 0.0–0.1)
Basophils Relative: 0.5 % (ref 0.0–3.0)
Eosinophils Absolute: 0.3 10*3/uL (ref 0.0–0.7)
Eosinophils Relative: 3.1 % (ref 0.0–5.0)
HCT: 39.7 % (ref 36.0–46.0)
Hemoglobin: 12.8 g/dL (ref 12.0–15.0)
Lymphocytes Relative: 30.2 % (ref 12.0–46.0)
Lymphs Abs: 2.9 10*3/uL (ref 0.7–4.0)
MCHC: 32.3 g/dL (ref 30.0–36.0)
MCV: 90.4 fl (ref 78.0–100.0)
Monocytes Absolute: 1 10*3/uL (ref 0.1–1.0)
Monocytes Relative: 10.7 % (ref 3.0–12.0)
Neutro Abs: 5.3 10*3/uL (ref 1.4–7.7)
Neutrophils Relative %: 55.5 % (ref 43.0–77.0)
Platelets: 317 10*3/uL (ref 150.0–400.0)
RBC: 4.39 Mil/uL (ref 3.87–5.11)
RDW: 13.8 % (ref 11.5–15.5)
WBC: 9.5 10*3/uL (ref 4.0–10.5)

## 2023-07-10 LAB — LIPID PANEL
Cholesterol: 144 mg/dL (ref 0–200)
HDL: 47.4 mg/dL (ref >39.00)
LDL Cholesterol: 78 mg/dL (ref 0–99)
NonHDL: 96.41
Total CHOL/HDL Ratio: 3
Triglycerides: 92 mg/dL (ref 0.0–149.0)
VLDL: 18.4 mg/dL (ref 0.0–40.0)

## 2023-07-10 LAB — VITAMIN D 25 HYDROXY (VIT D DEFICIENCY, FRACTURES): VITD: 23.87 ng/mL — ABNORMAL LOW (ref 30.00–100.00)

## 2023-07-10 LAB — B12 AND FOLATE PANEL
Folate: 10.1 ng/mL (ref >5.9)
Vitamin B-12: 155 pg/mL — ABNORMAL LOW (ref 211–911)

## 2023-07-10 LAB — TSH: TSH: 1.55 u[IU]/mL (ref 0.35–5.50)

## 2023-07-10 LAB — HEMOGLOBIN A1C: Hgb A1c MFr Bld: 5.8 % (ref 4.6–6.5)

## 2023-07-10 MED ORDER — OMEPRAZOLE 20 MG PO CPDR: 20.0000 mg | DELAYED_RELEASE_CAPSULE | Freq: Every day | ORAL | 3 refills | Status: DC

## 2023-07-10 NOTE — Patient Instructions (Addendum)
Vaccines I recommend: Covid booster- new this fall Tetanus shot.  I am glad Zepbound is working well for you. For heartburn, take omeprazole 20 mg 1 tablet every day.  I actually sent the prescription.   Check the  blood pressure regularly Blood pressure goal:  between 110/65 and  135/85. If it is consistently higher or lower, let me know     GO TO THE LAB : Get the blood work     Next visit with me 3 to 4 months for a checkup     Please schedule it at the front desk   If you have MyChart, please check frequently in the next few days for your results

## 2023-07-10 NOTE — Assessment & Plan Note (Signed)
Here for CPX - Td 09-2013.  Booster today - s/p shingrix x 2 per pt ([facility]) -Vaccines I recommend:   Flu shot, COVID booster. --  female care: per gyn , had a MMG 10/ 2023.  ([facility]) -- CCS: Colonoscopy 05/04/2023.  Next per GI --Labs:   CMP FLP CBC A1c TSH vitamin D --Diet and exercise: Discussed.

## 2023-07-10 NOTE — Progress Notes (Signed)
Subjective:    Patient ID: Cassidy Shepard, female    DOB: 02/23/1971, 52 y.o.   MRN: [id number]  DOS:  07/10/2023 Type of visit - description: CPX  Here for CPX. Doing well, on Zepbound, has some heartburn and feeling slightly bloated but otherwise working well for her  Wt Readings from Last 3 Encounters:  07/10/23 197 lb 2 oz (89.4 kg)  05/25/23 203 lb 12.8 oz (92.4 kg)  07/07/22 189 lb 8 oz (86 kg)   Review of Systems  Other than above, a 14 point review of systems is negative      Past Medical History:  Diagnosis Date   Anxiety and depression, insomnia 10/23/2009   Unspecified vitamin D deficiency 05/22/2014    Past Surgical History:  Procedure Laterality Date   APPENDECTOMY     CESAREAN SECTION     x 2   CHOLECYSTECTOMY     INTRAUTERINE DEVICE INSERTION  july 2016   SKIN SURGERY  02/20/16   Social History   Socioeconomic History   Marital status: Divorced    Spouse name: Not on file   Number of children: 2   Years of education: Not on file   Highest education level: Not on file  Occupational History   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  Tobacco Use   Smoking status: Former   Smokeless tobacco: Never   Tobacco comments:    smoke a little as a teenager   Substance and Sexual Activity   Alcohol use: Yes    Alcohol/week: 0.0 standard drinks of alcohol    Comment: socially    Drug use: No   Sexual activity: Not on file  Other Topics Concern   Not on file  Social History Narrative   Married, separated , moved to her own place ~ 11-2014   2 daughters 2001, 2005, shared custody, both in college   Original from [place]       Social Determinants of Corporate investment banker Strain: Not on [employer] Insecurity: Not on file  Transportation Needs: Not on file  Physical Activity: Not on file  Stress: Not on file  Social Connections: Not on file  Intimate Partner Violence: Not on file     Current Outpatient  Medications  Medication Instructions   ALPRAZolam (XANAX) 0.5 mg, Oral, At bedtime PRN   Cholecalciferol (VITAMIN D) 50 MCG (2000 UT) CAPS 1 capsule, Oral, Daily   metoprolol tartrate (LOPRESSOR) 25 mg, Oral, 2 times daily   omeprazole (PRILOSEC) 20 mg, Oral, Daily   PARAGARD INTRAUTERINE COPPER IUD IUD 1 each, Intrauterine,  Once   Zepbound 5 mg, Subcutaneous, Weekly       Objective:   Physical Exam BP 122/68   Pulse (!) 59   Temp 98.1 F (36.7 C) (Oral)   Resp 16   Ht 5\' 4"  (1.626 m)   Wt 197 lb 2 oz (89.4 kg)   SpO2 96%   BMI 33.84 kg/m  General: Well developed, NAD, BMI noted Neck: No  thyromegaly  HEENT:  Normocephalic . Face symmetric, atraumatic Lungs:  CTA B Normal respiratory effort, no intercostal retractions, no accessory muscle use. Heart: RRR,  no murmur.  Abdomen:  Not distended, soft, non-tender. No rebound or rigidity.   Lower extremities: no pretibial edema bilaterally  Skin: Exposed areas without rash. Not pale. Not jaundice Neurologic:  alert & oriented X3.  Speech normal, gait appropriate for age and  unassisted Strength symmetric and appropriate for age.  Psych: Cognition and judgment appear intact.  Cooperative with normal attention span and concentration.  Behavior appropriate. No anxious or depressed appearing.     Assessment   Assessment HTN Anxiety, depression, insomnia ( started Xanax 2022) Vitamin D deficiency B12 deficiency Atypical mole, chest 02-2016 Birth control -- IUD  PLAN: Here for CPX - Td 09-2013.  Booster today - s/p shingrix x 2 [facility] pt ([facility]) -Vaccines I recommend:   Flu shot, COVID booster. --  female care: per gyn , had a MMG 10/ 2023.  ([facility]) -- CCS: Colonoscopy 05/04/2023.  Next per GI --Labs:   CMP FLP CBC A1c TSH vitamin D --Diet and exercise: Discussed. Anxiety depression insomnia: On Xanax at night, controlled. Vitamin D deficiency: On vitamin D 2000 units daily, checking labs.  Consider ergocalciferol. B12  deficiency, history of, not on supplements, labs. Obesity: Started Zepbound, already losing weight, occasional heartburn and feeling bloated.  Rec: Continue Zepbound, start PPIs. RTC 4 months

## 2023-07-10 NOTE — Assessment & Plan Note (Signed)
Here for CPX Anxiety depression insomnia: On Xanax at night, controlled. Vitamin D deficiency: On vitamin D 2000 units daily, checking labs.  Consider ergocalciferol. B12 deficiency, history of, not on supplements, labs. Obesity: Started Zepbound, already losing weight, occasional heartburn and feeling bloated.  Rec: Continue Zepbound, start PPIs. RTC 4 months

## 2023-07-13 MED ORDER — VITAMIN D (ERGOCALCIFEROL) 1.25 MG (50000 UNIT) PO CAPS: 50000.0000 [IU] | ORAL_CAPSULE | ORAL | 0 refills | Status: AC

## 2023-07-13 NOTE — Addendum Note (Signed)
Addended byConrad Somerset D on: 07/13/2023 07:49 AM   Modules accepted: Orders

## 2023-07-28 LAB — HM MAMMOGRAPHY

## 2023-07-30 ENCOUNTER — Encounter: Payer: Self-pay | Admitting: Internal Medicine

## 2023-08-04 ENCOUNTER — Other Ambulatory Visit: Payer: Self-pay | Admitting: Internal Medicine

## 2023-08-04 MED ORDER — ZEPBOUND 7.5 MG/0.5ML ~~LOC~~ SOAJ: 7.5000 mg | SUBCUTANEOUS | 0 refills | Status: DC

## 2023-08-14 ENCOUNTER — Encounter: Payer: Self-pay | Admitting: Internal Medicine

## 2023-08-21 ENCOUNTER — Telehealth: Payer: Self-pay | Admitting: Internal Medicine

## 2023-08-21 NOTE — Telephone Encounter (Signed)
Requesting: alprazolam 0.5mg   Contract: 06/04/23  UDS: 07/07/22 Last Visit: 07/10/23 Next Visit: 11/10/23 Last Refill: 03/12/23 #30 and 2RF   Please Advise

## 2023-08-21 NOTE — Telephone Encounter (Signed)
PDMP reviewed, prescription sent 

## 2023-08-25 ENCOUNTER — Encounter: Payer: Self-pay | Admitting: Internal Medicine

## 2023-08-27 NOTE — Telephone Encounter (Signed)
 Completed form faxed back to [employer] at [phone]. Form sent for scanning.

## 2023-09-02 ENCOUNTER — Other Ambulatory Visit: Payer: Self-pay | Admitting: Internal Medicine

## 2023-09-02 MED ORDER — ZEPBOUND 10 MG/0.5ML ~~LOC~~ SOAJ: 10.0000 mg | SUBCUTANEOUS | 0 refills | Status: DC

## 2023-09-23 DIAGNOSIS — H9311 Tinnitus, right ear: Secondary | ICD-10-CM | POA: Insufficient documentation

## 2023-09-30 ENCOUNTER — Other Ambulatory Visit: Payer: Self-pay | Admitting: Internal Medicine

## 2023-10-01 MED ORDER — ZEPBOUND 10 MG/0.5ML ~~LOC~~ SOAJ: 10.0000 mg | SUBCUTANEOUS | 0 refills | Status: DC

## 2023-11-02 ENCOUNTER — Other Ambulatory Visit: Payer: Self-pay | Admitting: Internal Medicine

## 2023-11-02 MED ORDER — ZEPBOUND 12.5 MG/0.5ML ~~LOC~~ SOAJ: 12.5000 mg | SUBCUTANEOUS | 0 refills | Status: DC

## 2023-11-10 ENCOUNTER — Ambulatory Visit: Payer: 59 | Admitting: Internal Medicine

## 2023-11-10 ENCOUNTER — Encounter: Payer: Self-pay | Admitting: Internal Medicine

## 2023-11-10 VITALS — BP 108/64 | HR 69 | Temp 98.3°F | Resp 16 | Ht 64.0 in | Wt 173.4 lb

## 2023-11-10 DIAGNOSIS — E538 Deficiency of other specified B group vitamins: Secondary | ICD-10-CM

## 2023-11-10 DIAGNOSIS — Z6829 Body mass index (BMI) 29.0-29.9, adult: Secondary | ICD-10-CM

## 2023-11-10 DIAGNOSIS — E559 Vitamin D deficiency, unspecified: Secondary | ICD-10-CM | POA: Diagnosis not present

## 2023-11-10 DIAGNOSIS — E66811 Obesity, class 1: Secondary | ICD-10-CM | POA: Diagnosis not present

## 2023-11-10 MED ORDER — PANTOPRAZOLE SODIUM 40 MG PO TBEC: 40.0000 mg | DELAYED_RELEASE_TABLET | Freq: Every day | ORAL | 1 refills | Status: DC

## 2023-11-10 NOTE — Progress Notes (Unsigned)
   Subjective:    Patient ID: Cassidy Shepard, female    DOB: January 09, 1971, 53 y.o.   MRN: [id number]  DOS:  11/10/2023 Type of visit - description: Follow-up  Here for follow-up. In the last 3 weeks has noted loose stools, some belching with a acidic taste. Denies nausea per se, no vomiting.  No blood in the stools.  No constipation.  Wt Readings from Last 3 Encounters:  11/10/23 173 lb 6 oz (78.6 kg)  07/10/23 197 lb 2 oz (89.4 kg)  05/25/23 203 lb 12.8 oz (92.4 kg)     Review of Systems See above   Past Medical History:  Diagnosis Date   Anxiety and depression, insomnia 10/23/2009   Unspecified vitamin D deficiency 05/22/2014    Past Surgical History:  Procedure Laterality Date   APPENDECTOMY     CESAREAN SECTION     x 2   CHOLECYSTECTOMY     INTRAUTERINE DEVICE INSERTION  july 2016   SKIN SURGERY  02/20/16    Current Outpatient Medications  Medication Instructions   ALPRAZolam (XANAX) 0.5 mg, Oral, At bedtime PRN   Cholecalciferol (VITAMIN D) 50 MCG (2000 UT) CAPS 1 capsule, Daily   metoprolol tartrate (LOPRESSOR) 25 mg, Oral, 2 times daily   omeprazole (PRILOSEC) 20 mg, Oral, Daily   PARAGARD INTRAUTERINE COPPER IUD IUD 1 each,  Once   Zepbound 12.5 mg, Subcutaneous, Weekly       Objective:   Physical Exam BP 108/64   Pulse 69   Temp 98.3 F (36.8 C) (Oral)   Resp 16   Ht 5\' 4"  (1.626 m)   Wt 173 lb 6 oz (78.6 kg)   SpO2 97%   BMI 29.76 kg/m  General:   Well developed, NAD, BMI noted.  HEENT:  Normocephalic . Face symmetric, atraumatic Lungs:  CTA B Normal respiratory effort, no intercostal retractions, no accessory muscle use. Heart: RRR,  no murmur.  Abdomen:  Not distended, soft, non-tender. No rebound or rigidity.   Skin: Not pale. Not jaundice Lower extremities: no pretibial edema bilaterally  Neurologic:  alert & oriented X3.  Speech normal, gait appropriate for age and unassisted Psych--  Cognition and judgment appear intact.  Cooperative with  normal attention span and concentration.  Behavior appropriate. No anxious or depressed appearing.     Assessment    Problem list HTN Anxiety, depression, insomnia ( started Xanax 2022) Vitamin D deficiency B12 deficiency Atypical mole, chest 02-2016 Birth control -- IUD  PLAN: Obesity: Started Zepbound 05-2023, initial weight 203 pounds, weight today 173. Doing very well with diet, exercising 2-3 times a week. When increased zepbound dose to 10 mg weekly, she started w/ s/e  including belching, some diarrhea. Sxs are not severe, pt is not sure if they are enough for her to stop the medicine At this point we agreed on the following: - Use the Zepbound 12.5 mg weekly that she has at home, in 2 to 3 weeks let me know if she wishes to continue with the same dose or decrease it. - Metamucil 1 or 2 capsules in the morning to see if that help with diarrhea. - Change omeprazole to pantoprazole 40 mg to see if that will help with the belching with an acidic taste. Vitamin B12, vitamin D deficiencies: Labs done few months ago conferment low vitamins, recommended ergocalciferol weekly and B12 supplements.  Good compliance, recheck today.  RTC 3 to 4 months

## 2023-11-10 NOTE — Patient Instructions (Addendum)
Stop omeprazole, start pantoprazole 1 tablet before breakfast  For diarrhea, try Metamucil capsules 1 or 2 with your breakfast or can also try Benefiber.  Vaccines I recommend: Covid booster Shingrix (shingles)  Call in about 3 weeks, let me know if he would like to continue with the same dose of Zepbound.     GO TO THE LAB : Get the blood work     Next visit with me in 3 to 4 months     Please schedule it at the front desk

## 2023-11-11 LAB — VITAMIN D 25 HYDROXY (VIT D DEFICIENCY, FRACTURES): Vit D, 25-Hydroxy: 74 ng/mL (ref 30–100)

## 2023-11-11 LAB — B12 AND FOLATE PANEL
Folate: 11.5 ng/mL
Vitamin B-12: 474 pg/mL (ref 200–1100)

## 2023-11-11 NOTE — Assessment & Plan Note (Signed)
Obesity: Started Zepbound 05-2023, initial weight 203 pounds, weight today 173. Doing very well with diet, exercising 2-3 times a week. When increased zepbound dose to 10 mg weekly, she started w/ s/e  including belching, some diarrhea. Sxs are not severe, pt is not sure if they are enough for her to stop the medicine At this point we agreed on the following: - Use the Zepbound 12.5 mg weekly that she has at home, in 2 to 3 weeks let me know if she wishes to continue with the same dose or decrease it. - Metamucil 1 or 2 capsules in the morning to see if that help with diarrhea. - Change omeprazole to pantoprazole 40 mg to see if that will help with the belching with an acidic taste. Vitamin B12, vitamin D deficiencies: Labs done few months ago conferment low vitamins, recommended ergocalciferol weekly and B12 supplements.  Good compliance, recheck today.

## 2023-11-12 ENCOUNTER — Encounter: Payer: Self-pay | Admitting: Internal Medicine

## 2023-11-27 ENCOUNTER — Other Ambulatory Visit: Payer: Self-pay | Admitting: Internal Medicine

## 2023-11-27 MED ORDER — ZEPBOUND 10 MG/0.5ML ~~LOC~~ SOAJ: 10.0000 mg | SUBCUTANEOUS | 0 refills | Status: DC

## 2023-12-07 ENCOUNTER — Other Ambulatory Visit: Payer: Self-pay | Admitting: Internal Medicine

## 2024-01-08 ENCOUNTER — Other Ambulatory Visit: Payer: Self-pay | Admitting: Internal Medicine

## 2024-01-08 MED ORDER — ZEPBOUND 10 MG/0.5ML ~~LOC~~ SOAJ: 10.0000 mg | SUBCUTANEOUS | 0 refills | Status: DC

## 2024-01-11 ENCOUNTER — Telehealth: Payer: Self-pay | Admitting: Pharmacy Technician

## 2024-01-11 ENCOUNTER — Other Ambulatory Visit (HOSPITAL_COMMUNITY): Payer: Self-pay

## 2024-01-11 NOTE — Telephone Encounter (Signed)
 Pharmacy Patient Advocate Encounter   Received notification from CoverMyMeds that prior authorization for ZEPBOUND 10MG  is required/requested.   Insurance verification completed.   The patient is insured through [facility] .   Per test claim: [id number]  Submitted and pending

## 2024-01-11 NOTE — Telephone Encounter (Signed)
 Pharmacy Patient Advocate Encounter  Received notification from [facility] that Prior Authorization for Zepbound 10MG /0.5ML has been APPROVED from 01/11/24 to 01/10/25. Ran test claim, Copay is $24.99. This test claim was processed through [facility]- copay amounts may vary at other pharmacies due to pharmacy/plan contracts, or as the patient moves through the different stages of their insurance plan.   PA #/Case ID/Reference #: [id number]

## 2024-01-27 ENCOUNTER — Other Ambulatory Visit: Payer: Self-pay | Admitting: Internal Medicine

## 2024-01-27 MED ORDER — ZEPBOUND 7.5 MG/0.5ML ~~LOC~~ SOAJ: 7.5000 mg | SUBCUTANEOUS | 0 refills | Status: DC

## 2024-02-22 ENCOUNTER — Other Ambulatory Visit: Payer: Self-pay | Admitting: Internal Medicine

## 2024-02-22 MED ORDER — ZEPBOUND 7.5 MG/0.5ML ~~LOC~~ SOAJ: 7.5000 mg | SUBCUTANEOUS | 0 refills | Status: DC

## 2024-02-25 ENCOUNTER — Telehealth: Payer: Self-pay | Admitting: Internal Medicine

## 2024-02-26 NOTE — Telephone Encounter (Signed)
 Requesting: alprazolam  0.5mg   Contract: 06/04/23 UDS: 07/07/22 Last Visit: 11/10/23 Next Visit: 03/08/24 Last Refill: 08/21/23 #30 and 4RF  Please Advise

## 2024-02-26 NOTE — Telephone Encounter (Signed)
 PDMP okay, Rx sent

## 2024-03-08 ENCOUNTER — Encounter: Payer: Self-pay | Admitting: Internal Medicine

## 2024-03-08 ENCOUNTER — Ambulatory Visit: Payer: 59 | Admitting: Internal Medicine

## 2024-03-08 VITALS — BP 126/60 | HR 70 | Temp 98.0°F | Resp 16 | Ht 64.0 in | Wt 160.1 lb

## 2024-03-08 DIAGNOSIS — E66811 Obesity, class 1: Secondary | ICD-10-CM

## 2024-03-08 DIAGNOSIS — R079 Chest pain, unspecified: Secondary | ICD-10-CM

## 2024-03-08 NOTE — Progress Notes (Signed)
 Subjective:    Patient ID: Cassidy Shepard, female    DOB: 1970-12-04, 53 y.o.   MRN: [id number]  DOS:  03/08/2024 Type of visit - description: Routine office visit  Doing well on Zepbound . Has a healthy lifestyle. Denies diarrhea.  No nausea or vomiting.  Also, approximately April 18 she was in [place] visiting her parents.  She was under a lot of stress. One night she was awakened by right/mid chest pain, it quickly went to the left side of the chest.  Lasted approximately 2 to 3 minutes and then it self resolved. While visiting [place] she was very active, going up to a third floor multiple times without chest pain, DOE, palpitations. Did not have any calf swelling or pain.  She flew back to [facility] on May 2, since then remains asymptomatic, going to the gym as usual without any problems.  Wt Readings from Last 3 Encounters:  03/08/24 160 lb 2 oz (72.6 kg)  11/10/23 173 lb 6 oz (78.6 kg)  07/10/23 197 lb 2 oz (89.4 kg)     Review of Systems See above   Past Medical History:  Diagnosis Date   Anxiety and depression, insomnia 10/23/2009   Unspecified vitamin D  deficiency 05/22/2014    Past Surgical History:  Procedure Laterality Date   APPENDECTOMY     CESAREAN SECTION     x 2   CHOLECYSTECTOMY     INTRAUTERINE DEVICE INSERTION  july 2016   SKIN SURGERY  02/20/16    Current Outpatient Medications  Medication Instructions   ALPRAZolam  (XANAX ) 0.5 mg, Oral, At bedtime PRN   Cholecalciferol (VITAMIN D ) 50 MCG (2000 UT) CAPS 1 capsule, Daily   metoprolol  tartrate (LOPRESSOR ) 25 mg, Oral, 2 times daily   pantoprazole  (PROTONIX ) 40 mg, Oral, Daily before breakfast   PARAGARD INTRAUTERINE COPPER IUD IUD 1 each,  Once   Vitamin B 12 1,000 mcg, Every other day   Zepbound  7.5 mg, Subcutaneous, Weekly       Objective:   Physical Exam BP 126/60   Pulse 70   Temp 98 F (36.7 C) (Oral)   Resp 16   Ht 5\' 4"  (1.626 m)   Wt 160 lb 2 oz (72.6 kg)   SpO2 97%   BMI 27.49  kg/m  General:   Well developed, NAD, BMI noted. HEENT:  Normocephalic . Face symmetric, atraumatic Lungs:  CTA B Normal respiratory effort, no intercostal retractions, no accessory muscle use. Heart: RRR,  no murmur.  Lower extremities: Calves symmetric, no TTP. Skin: Not pale. Not jaundice Neurologic:  alert & oriented X3.  Speech normal, gait appropriate for age and unassisted Psych--  Cognition and judgment appear intact.  Cooperative with normal attention span and concentration.  Behavior appropriate. No anxious or depressed appearing.      Assessment     Problem list HTN Anxiety, depression, insomnia ( started Xanax  2022) Vitamin D  deficiency B12 deficiency Atypical mole, chest 02-2016 Birth control -- IUD  PLAN: Obesity: Started Zepbound  05-2023, initial weight 203 pounds, now 160 pounds.  She elected to decrease Zepbound  to 7.5 mg, feels better with that dose, continue with a healthy lifestyle, exercises 3-4 times a week, is trying to increase her protein intake and decrease carbohydrate intake. Plan: Continue present care. Vitamin Deficiencies: Vitamin D , B12 and folic acid  were normal will check January 2025 Chest pain: As described above, on clinical grounds very low suspicions for PE or a cardiovascular event.  This was explained to the  patient, we agreed on observation, she will let me know if symptoms resurface. RTC 06-2023 CPX

## 2024-03-08 NOTE — Patient Instructions (Signed)
 Call immediately if have chest pain, difficulty breathing or palpitation   Next office visit for a physical exam by 06-2024 Please make an appointment before you leave today

## 2024-03-09 NOTE — Assessment & Plan Note (Signed)
 Obesity: Started Zepbound  05-2023, initial weight 203 pounds, now 160 pounds.  She elected to decrease Zepbound  to 7.5 mg, feels better with that dose, continue with a healthy lifestyle, exercises 3-4 times a week, is trying to increase her protein intake and decrease carbohydrate intake. Plan: Continue present care. Vitamin Deficiencies: Vitamin D , B12 and folic acid  were normal will check January 2025 Chest pain: As described above, on clinical grounds very low suspicions for PE or a cardiovascular event.  This was explained to the patient, we agreed on observation, she will let me know if symptoms resurface. RTC 06-2023 CPX

## 2024-03-24 ENCOUNTER — Encounter: Payer: Self-pay | Admitting: Internal Medicine

## 2024-03-25 MED ORDER — ZEPBOUND 7.5 MG/0.5ML ~~LOC~~ SOAJ: 7.5000 mg | SUBCUTANEOUS | 1 refills | Status: DC

## 2024-04-26 ENCOUNTER — Encounter: Payer: Self-pay | Admitting: Internal Medicine

## 2024-04-27 MED ORDER — ZEPBOUND 7.5 MG/0.5ML ~~LOC~~ SOAJ: 7.5000 mg | SUBCUTANEOUS | 1 refills | Status: DC

## 2024-05-16 ENCOUNTER — Telehealth: Payer: Self-pay

## 2024-05-16 NOTE — Telephone Encounter (Signed)
 LMOM asking for call back.

## 2024-05-16 NOTE — Telephone Encounter (Signed)
 Initial Comment Caller states she is needing a prescription due to forgetting to pack her BP medications. Has a headache Translation No No Triage Reason Patient declined Nurse Assessment Nurse: Trudy, RN, Whitney Date/Time (Eastern Time): 05/14/2024 9:42:03 AM Confirm and document reason for call. If symptomatic, describe symptoms. ---Caller states she left her blood pressure medication, she has a headache. She has been without it for about 36 hours now. Does the patient have any new or worsening symptoms? ---Yes Will a triage be completed? ---Yes Related visit to physician within the last 2 weeks? ---No Does the PT have any chronic conditions? (i.e. diabetes, asthma, this includes High risk factors for pregnancy, etc.) ---Yes List chronic conditions. ---hypertension Is the patient pregnant or possibly pregnant? (Ask all females between the ages of 23-55) ---No Is this a behavioral health or substance abuse call? ---No Guidelines Guideline Title Affirmed Question Affirmed Notes Nurse Date/Time (Eastern Time) Headache [1] New-onset headache AND [2] age > 21 years Trudy OBIE Folks 05/14/2024 9:44:51 AM PLEASE NOTE: All timestamps contained within this report are represented as [place] Standard Time. CONFIDENTIALTY NOTICE: This fax transmission is intended only for the addressee. It contains information that is legally privileged, confidential or otherwise protected from use or disclosure. If you are not the intended recipient, you are strictly prohibited from reviewing, disclosing, copying using or disseminating any of this information or taking any action in reliance on or regarding this information. If you have received this fax in error, please notify us  immediately by telephone so that we can arrange for its return to us . Phone: [phone], Toll-Free: [phone], Fax: [phone] ANA_GIL 1971-08-10 Page: 1 of2 CallId: [id number] Disp. Time Titus Time)  Disposition Final User 05/14/2024 9:47:20 AM SEE PCP WITHIN 3 DAYS Yes Trudy, RN, Whitney Final Disposition 05/14/2024 9:47:20 AM SEE PCP WITHIN 3 DAYS Yes Trudy, RN, Whitney Caller Disagree/Comply Disagree Caller Understands Yes PreDisposition InappropriateToAsk Care Advice Given Per Guideline SEE PCP WITHIN 3 DAYS: * You need to be seen within 2 or 3 days. CALL BACK IF: * You become worse Comments User: Folks Trudy, RN Date/Time Titus Time): 05/14/2024 9:48:10 AM RN advised caller to call [facility] and see if they can give her a loaner dose on the medication, if not please call us  back. Caller verbalized understanding. Referrals GO TO FACILITY REFUSED

## 2024-07-12 ENCOUNTER — Ambulatory Visit (INDEPENDENT_AMBULATORY_CARE_PROVIDER_SITE_OTHER): Admitting: Internal Medicine

## 2024-07-12 ENCOUNTER — Encounter: Payer: Self-pay | Admitting: Internal Medicine

## 2024-07-12 VITALS — BP 118/86 | HR 69 | Temp 98.0°F | Resp 16 | Ht 64.0 in | Wt 157.5 lb

## 2024-07-12 DIAGNOSIS — Z0000 Encounter for general adult medical examination without abnormal findings: Secondary | ICD-10-CM | POA: Diagnosis not present

## 2024-07-12 DIAGNOSIS — I10 Essential (primary) hypertension: Secondary | ICD-10-CM

## 2024-07-12 DIAGNOSIS — Z23 Encounter for immunization: Secondary | ICD-10-CM | POA: Diagnosis not present

## 2024-07-12 DIAGNOSIS — R739 Hyperglycemia, unspecified: Secondary | ICD-10-CM | POA: Diagnosis not present

## 2024-07-12 LAB — COMPREHENSIVE METABOLIC PANEL WITH GFR
ALT: 9 U/L (ref 0–35)
AST: 12 U/L (ref 0–37)
Albumin: 4.1 g/dL (ref 3.5–5.2)
Alkaline Phosphatase: 53 U/L (ref 39–117)
BUN: 8 mg/dL (ref 6–23)
CO2: 29 meq/L (ref 19–32)
Calcium: 9.6 mg/dL (ref 8.4–10.5)
Chloride: 105 meq/L (ref 96–112)
Creatinine, Ser: 0.68 mg/dL (ref 0.40–1.20)
GFR: 99.93 mL/min (ref >60.00)
Glucose, Bld: 85 mg/dL (ref 70–99)
Potassium: 4.7 meq/L (ref 3.5–5.1)
Sodium: 140 meq/L (ref 135–145)
Total Bilirubin: 0.5 mg/dL (ref 0.2–1.2)
Total Protein: 7.5 g/dL (ref 6.0–8.3)

## 2024-07-12 LAB — CBC WITH DIFFERENTIAL/PLATELET
Basophils Absolute: 0 K/uL (ref 0.0–0.1)
Basophils Relative: 0.5 % (ref 0.0–3.0)
Eosinophils Absolute: 0.2 K/uL (ref 0.0–0.7)
Eosinophils Relative: 3 % (ref 0.0–5.0)
HCT: 38.1 % (ref 36.0–46.0)
Hemoglobin: 12.6 g/dL (ref 12.0–15.0)
Lymphocytes Relative: 31.9 % (ref 12.0–46.0)
Lymphs Abs: 2.1 K/uL (ref 0.7–4.0)
MCHC: 33.1 g/dL (ref 30.0–36.0)
MCV: 87.5 fl (ref 78.0–100.0)
Monocytes Absolute: 0.6 K/uL (ref 0.1–1.0)
Monocytes Relative: 9.8 % (ref 3.0–12.0)
Neutro Abs: 3.6 K/uL (ref 1.4–7.7)
Neutrophils Relative %: 54.8 % (ref 43.0–77.0)
Platelets: 276 K/uL (ref 150.0–400.0)
RBC: 4.36 Mil/uL (ref 3.87–5.11)
RDW: 13.7 % (ref 11.5–15.5)
WBC: 6.6 K/uL (ref 4.0–10.5)

## 2024-07-12 LAB — LIPID PANEL
Cholesterol: 152 mg/dL (ref 0–200)
HDL: 44 mg/dL (ref >39.00)
LDL Cholesterol: 93 mg/dL (ref 0–99)
NonHDL: 108.46
Total CHOL/HDL Ratio: 3
Triglycerides: 78 mg/dL (ref 0.0–149.0)
VLDL: 15.6 mg/dL (ref 0.0–40.0)

## 2024-07-12 LAB — HEMOGLOBIN A1C: Hgb A1c MFr Bld: 5.7 % (ref 4.6–6.5)

## 2024-07-12 MED ORDER — ZOLPIDEM TARTRATE 5 MG PO TABS: 5.0000 mg | ORAL_TABLET | Freq: Every evening | ORAL | 1 refills | Status: DC | PRN

## 2024-07-12 NOTE — Patient Instructions (Signed)
 Vaccines I recommend: Flu shot and a COVID booster this fall  Stop Xanax  Take Ambien  5 mg 1 tablet before bedtime or  in the middle of the night if you wake up. Please read information about good sleep habits  GO TO THE LAB :  Get the blood work   Your results will be posted on MyChart with my comments  Go to the front desk for the checkout Please make an appointment for a checkup in 6 months   HEALTHY SLEEP Sleep hygiene: Basic rules for a good night's sleep  Sleep only as much as you need to feel rested and then get out of bed  Keep a regular sleep schedule  Avoid forcing sleep  Exercise regularly for at least 20 minutes, preferably 4 to 5 hours before bedtime  Avoid caffeinated beverages after lunch  Avoid alcohol near bedtime: no night cap  Avoid smoking, especially in the evening  Do not go to bed hungry  Adjust bedroom environment  Avoid prolonged use of light-emitting screens before bedtime   Deal with your worries before bedtime

## 2024-07-12 NOTE — Assessment & Plan Note (Signed)
 Here for CPX - Td 09-2023 - s/p shingrix x 2 per pt ([facility]) -Vaccines I recommend:   Flu shot, COVID booster. --  female care: per gyn , had a MMG 10/ 2024.  ([facility]) -- CCS: Colonoscopy 05/04/2023.  Next per GI --Labs:FLP A1c hepatitis B serology CMP CBC --Diet and exercise: See below

## 2024-07-12 NOTE — Progress Notes (Signed)
 Subjective:    Patient ID: Cassidy Shepard, female    DOB: 1971/05/23, 53 y.o.   MRN: [id number]  DOS:  07/12/2024 Type of visit - description: CPX  Here for CPX. Feeling well. Would like to stop Xanax  and try something different.  Wt Readings from Last 3 Encounters:  07/12/24 157 lb 8 oz (71.4 kg)  03/08/24 160 lb 2 oz (72.6 kg)  11/10/23 173 lb 6 oz (78.6 kg)    Review of Systems  Other than above, a 14 point review of systems is negative       Past Medical History:  Diagnosis Date   Anxiety and depression, insomnia 10/23/2009   Unspecified vitamin D  deficiency 05/22/2014    Past Surgical History:  Procedure Laterality Date   APPENDECTOMY     CESAREAN SECTION     x 2   CHOLECYSTECTOMY     INTRAUTERINE DEVICE INSERTION  july 2016   SKIN SURGERY  02/20/16   Social History   Social History Narrative   Married, separated , moved to her own place ~ 11-2014   2 daughters 2001, 2005, shared custody, both in college   Original from [place]         Current Outpatient Medications  Medication Instructions   Cholecalciferol (VITAMIN D ) 50 MCG (2000 UT) CAPS 1 capsule, Daily   metoprolol  tartrate (LOPRESSOR ) 25 mg, Oral, 2 times daily   PARAGARD INTRAUTERINE COPPER IUD IUD 1 each,  Once   Vitamin B 12 1,000 mcg, Every other day   Zepbound  7.5 mg, Subcutaneous, Weekly   zolpidem  (AMBIEN ) 5 mg, Oral, At bedtime PRN       Objective:   Physical Exam BP 118/86   Pulse 69   Temp 98 F (36.7 C) (Oral)   Resp 16   Ht 5' 4 (1.626 m)   Wt 157 lb 8 oz (71.4 kg)   SpO2 99%   BMI 27.03 kg/m  General: Well developed, NAD, BMI noted Neck: No  thyromegaly  HEENT:  Normocephalic . Face symmetric, atraumatic Lungs:  CTA B Normal respiratory effort, no intercostal retractions, no accessory muscle use. Heart: RRR,  no murmur.  Abdomen:  Not distended, soft, non-tender. No rebound or rigidity.   Lower extremities: no pretibial edema bilaterally  Skin: Exposed areas without  rash. Not pale. Not jaundice Neurologic:  alert & oriented X3.  Speech normal, gait appropriate for age and unassisted Strength symmetric and appropriate for age.  Psych: Cognition and judgment appear intact.  Cooperative with normal attention span and concentration.  Behavior appropriate. No anxious or depressed appearing.     Assessment   Problem list HTN Anxiety, depression, insomnia ( started Xanax  2022) Vitamin D  deficiency B12 deficiency Atypical mole, chest 02-2016 Obesity: 203 pounds, BMI 34.9 - Rx GLP-1's 05/2023 Birth control -- IUD  PLAN: Here for CPX - Td 09-2023 - s/p [facility]ingrix x 2 [facility] ([facility]) -Vaccines I recommend:   Flu shot, COVID booster. --  female care: per gyn , had a MMG 10/ 2024.  ([facility]) -- CCS: Colonoscopy 05/04/2023.  Next per GI --Labs:FLP A1c hepatitis B serology CMP CBC --Diet and exercise: See below  Other issues addressed: HTN: BP looks good, on metoprolol . Anxiety, depression, insomnia: Currently feeling well emotionally, takes Xanax  most nights due to insomnia, typically wakes up in the middle of the night and can't  go back to sleep.  Would like to stop Xanax  and try something different.  PDMP ok; plan: Switch to Ambien   5 mg, 1 tablet nightly prn (or in the middle of the night she wakes up) ; also encouraged good sleep habits. Atypical mole: History of, recommend to see dermatology regularly. Obesity: Doing great, eating healthier.  Exercises 2-3 times a week. RTC 6 months

## 2024-07-12 NOTE — Assessment & Plan Note (Signed)
 Here for CPX   Other issues addressed: HTN: BP looks good, on metoprolol . Anxiety, depression, insomnia: Currently feeling well emotionally, takes Xanax  most nights due to insomnia, typically wakes up in the middle of the night and can't  go back to sleep.  Would like to stop Xanax  and try something different.  PDMP ok; plan: Switch to Ambien  5 mg, 1 tablet nightly prn (or in the middle of the night she wakes up) ; also encouraged good sleep habits. Atypical mole: History of, recommend to see dermatology regularly. Obesity: Doing great, eating healthier.  Exercises 2-3 times a week. RTC 6 months

## 2024-07-13 ENCOUNTER — Ambulatory Visit: Payer: Self-pay | Admitting: Internal Medicine

## 2024-07-13 LAB — HEPATITIS B SURFACE ANTIGEN: Hepatitis B Surface Ag: NONREACTIVE

## 2024-07-13 LAB — HEPATITIS B SURFACE ANTIBODY,QUALITATIVE: Hep B S Ab: REACTIVE — AB

## 2024-07-15 ENCOUNTER — Encounter: Payer: Self-pay | Admitting: Internal Medicine

## 2024-07-15 MED ORDER — METOPROLOL TARTRATE 25 MG PO TABS: 25.0000 mg | ORAL_TABLET | Freq: Two times a day (BID) | ORAL | 1 refills | Status: AC

## 2024-08-02 LAB — HM MAMMOGRAPHY

## 2024-08-03 ENCOUNTER — Encounter: Payer: Self-pay | Admitting: Internal Medicine

## 2024-10-17 ENCOUNTER — Other Ambulatory Visit: Payer: Self-pay | Admitting: Internal Medicine

## 2024-10-20 ENCOUNTER — Telehealth: Payer: Self-pay | Admitting: Internal Medicine

## 2024-10-21 NOTE — Telephone Encounter (Signed)
 PDMP okay, Rx sent

## 2024-10-21 NOTE — Telephone Encounter (Signed)
 Requesting: zolpidem  5mg  Contract: No last 05/2023 UDS: 07/07/2022 Last Visit: 07/12/2024 Next Visit: 01/10/2025 Last Refill: 07/12/24  Please Advise

## 2025-01-10 ENCOUNTER — Ambulatory Visit: Admitting: Internal Medicine
